# Patient Record
Sex: Female | Born: 1935 | Race: White | Hispanic: No
Health system: Southern US, Community
[De-identification: ages and names within clinical notes are randomized; demographics above are authoritative.]

---

## 2020-09-08 ENCOUNTER — Emergency Department (HOSPITAL_COMMUNITY): Payer: Medicare Other

## 2020-09-08 ENCOUNTER — Encounter (HOSPITAL_COMMUNITY): Payer: Self-pay

## 2020-09-08 ENCOUNTER — Other Ambulatory Visit: Payer: Self-pay

## 2020-09-08 ENCOUNTER — Inpatient Hospital Stay (HOSPITAL_COMMUNITY)
Admission: EM | Admit: 2020-09-08 | Discharge: 2020-09-14 | DRG: 871 | Disposition: A | Discharge status: Hospice | Payer: Medicare Other | Attending: Family Medicine | Admitting: Family Medicine

## 2020-09-08 DIAGNOSIS — Z20822 Contact with and (suspected) exposure to covid-19: Secondary | ICD-10-CM | POA: Diagnosis present

## 2020-09-08 DIAGNOSIS — Z66 Do not resuscitate: Secondary | ICD-10-CM | POA: Diagnosis present

## 2020-09-08 DIAGNOSIS — Z7401 Bed confinement status: Secondary | ICD-10-CM

## 2020-09-08 DIAGNOSIS — I50811 Acute right heart failure: Secondary | ICD-10-CM | POA: Diagnosis present

## 2020-09-08 DIAGNOSIS — J9601 Acute respiratory failure with hypoxia: Secondary | ICD-10-CM | POA: Diagnosis present

## 2020-09-08 DIAGNOSIS — R319 Hematuria, unspecified: Secondary | ICD-10-CM | POA: Diagnosis not present

## 2020-09-08 DIAGNOSIS — I351 Nonrheumatic aortic (valve) insufficiency: Secondary | ICD-10-CM | POA: Diagnosis not present

## 2020-09-08 DIAGNOSIS — A419 Sepsis, unspecified organism: Principal | ICD-10-CM | POA: Diagnosis present

## 2020-09-08 DIAGNOSIS — Z515 Encounter for palliative care: Secondary | ICD-10-CM

## 2020-09-08 DIAGNOSIS — I509 Heart failure, unspecified: Secondary | ICD-10-CM

## 2020-09-08 DIAGNOSIS — N390 Urinary tract infection, site not specified: Principal | ICD-10-CM

## 2020-09-08 DIAGNOSIS — I272 Pulmonary hypertension, unspecified: Secondary | ICD-10-CM | POA: Diagnosis present

## 2020-09-08 DIAGNOSIS — H9190 Unspecified hearing loss, unspecified ear: Secondary | ICD-10-CM | POA: Diagnosis present

## 2020-09-08 DIAGNOSIS — E039 Hypothyroidism, unspecified: Secondary | ICD-10-CM | POA: Diagnosis present

## 2020-09-08 DIAGNOSIS — G9341 Metabolic encephalopathy: Secondary | ICD-10-CM | POA: Diagnosis present

## 2020-09-08 DIAGNOSIS — Z7189 Other specified counseling: Secondary | ICD-10-CM | POA: Diagnosis not present

## 2020-09-08 DIAGNOSIS — J9602 Acute respiratory failure with hypercapnia: Secondary | ICD-10-CM | POA: Diagnosis present

## 2020-09-08 DIAGNOSIS — I110 Hypertensive heart disease with heart failure: Secondary | ICD-10-CM | POA: Diagnosis present

## 2020-09-08 DIAGNOSIS — I21A1 Myocardial infarction type 2: Secondary | ICD-10-CM | POA: Diagnosis present

## 2020-09-08 DIAGNOSIS — R54 Age-related physical debility: Secondary | ICD-10-CM | POA: Diagnosis present

## 2020-09-08 DIAGNOSIS — E785 Hyperlipidemia, unspecified: Secondary | ICD-10-CM | POA: Diagnosis present

## 2020-09-08 DIAGNOSIS — I214 Non-ST elevation (NSTEMI) myocardial infarction: Secondary | ICD-10-CM | POA: Diagnosis not present

## 2020-09-08 DIAGNOSIS — I361 Nonrheumatic tricuspid (valve) insufficiency: Secondary | ICD-10-CM | POA: Diagnosis not present

## 2020-09-08 DIAGNOSIS — R4182 Altered mental status, unspecified: Secondary | ICD-10-CM | POA: Diagnosis present

## 2020-09-08 LAB — CBC WITH DIFFERENTIAL/PLATELET
Abs Immature Granulocytes: 0.1 10*3/uL — ABNORMAL HIGH (ref 0.00–0.07)
Basophils Absolute: 0 10*3/uL (ref 0.0–0.1)
Basophils Relative: 0 %
Eosinophils Absolute: 0 10*3/uL (ref 0.0–0.5)
Eosinophils Relative: 0 %
HCT: 43.9 % (ref 36.0–46.0)
Hemoglobin: 13.6 g/dL (ref 12.0–15.0)
Immature Granulocytes: 1 %
Lymphocytes Relative: 6 %
Lymphs Abs: 0.5 10*3/uL — ABNORMAL LOW (ref 0.7–4.0)
MCH: 32.5 pg (ref 26.0–34.0)
MCHC: 31 g/dL (ref 30.0–36.0)
MCV: 105 fL — ABNORMAL HIGH (ref 80.0–100.0)
Monocytes Absolute: 0.5 10*3/uL (ref 0.1–1.0)
Monocytes Relative: 7 %
Neutro Abs: 6.6 10*3/uL (ref 1.7–7.7)
Neutrophils Relative %: 86 %
Platelets: 217 10*3/uL (ref 150–400)
RBC: 4.18 MIL/uL (ref 3.87–5.11)
RDW: 15.8 % — ABNORMAL HIGH (ref 11.5–15.5)
WBC: 7.8 10*3/uL (ref 4.0–10.5)
nRBC: 0 % (ref 0.0–0.2)

## 2020-09-08 LAB — URINALYSIS, ROUTINE W REFLEX MICROSCOPIC
Bacteria, UA: NONE SEEN
Bilirubin Urine: NEGATIVE
Glucose, UA: NEGATIVE mg/dL
Ketones, ur: NEGATIVE mg/dL
Nitrite: NEGATIVE
Protein, ur: 100 mg/dL — AB
RBC / HPF: >50 RBC/hpf — ABNORMAL HIGH (ref 0–5)
Specific Gravity, Urine: 1.018 (ref 1.005–1.030)
WBC, UA: >50 WBC/hpf — ABNORMAL HIGH (ref 0–5)
pH: 5 (ref 5.0–8.0)

## 2020-09-08 LAB — BLOOD GAS, ARTERIAL
Acid-Base Excess: 0.3 mmol/L (ref 0.0–2.0)
Acid-Base Excess: 4.9 mmol/L — ABNORMAL HIGH (ref 0.0–2.0)
Bicarbonate: 23 mmol/L (ref 20.0–28.0)
Bicarbonate: 27.7 mmol/L (ref 20.0–28.0)
FIO2: 80
FIO2: 28
O2 Saturation: 99.1 %
O2 Saturation: 87.5 %
Patient temperature: 35.3
Patient temperature: 37.2
pCO2 arterial: 64 mmHg — ABNORMAL HIGH (ref 32.0–48.0)
pCO2 arterial: 56.3 mmHg — ABNORMAL HIGH (ref 32.0–48.0)
pH, Arterial: 7.241 — ABNORMAL LOW (ref 7.350–7.450)
pH, Arterial: 7.35 (ref 7.350–7.450)
pO2, Arterial: 251 mmHg — ABNORMAL HIGH (ref 83.0–108.0)
pO2, Arterial: 59.7 mmHg — ABNORMAL LOW (ref 83.0–108.0)

## 2020-09-08 LAB — RESP PANEL BY RT-PCR (FLU A&B, COVID) ARPGX2
Influenza A by PCR: NEGATIVE
Influenza B by PCR: NEGATIVE
SARS Coronavirus 2 by RT PCR: NEGATIVE

## 2020-09-08 LAB — LACTIC ACID, PLASMA
Lactic Acid, Venous: 1.9 mmol/L (ref 0.5–1.9)
Lactic Acid, Venous: 1.6 mmol/L (ref 0.5–1.9)

## 2020-09-08 LAB — COMPREHENSIVE METABOLIC PANEL
ALT: 20 U/L (ref 0–44)
AST: 31 U/L (ref 15–41)
Albumin: 3.6 g/dL (ref 3.5–5.0)
Alkaline Phosphatase: 114 U/L (ref 38–126)
Anion gap: 10 (ref 5–15)
BUN: 44 mg/dL — ABNORMAL HIGH (ref 8–23)
CO2: 29 mmol/L (ref 22–32)
Calcium: 8.9 mg/dL (ref 8.9–10.3)
Chloride: 101 mmol/L (ref 98–111)
Creatinine, Ser: 1.08 mg/dL — ABNORMAL HIGH (ref 0.44–1.00)
GFR, Estimated: 48 mL/min — ABNORMAL LOW (ref >60)
Glucose, Bld: 117 mg/dL — ABNORMAL HIGH (ref 70–99)
Potassium: 4.3 mmol/L (ref 3.5–5.1)
Sodium: 140 mmol/L (ref 135–145)
Total Bilirubin: 0.8 mg/dL (ref 0.3–1.2)
Total Protein: 6.8 g/dL (ref 6.5–8.1)

## 2020-09-08 LAB — CK: Total CK: 180 U/L (ref 38–234)

## 2020-09-08 LAB — TROPONIN I (HIGH SENSITIVITY)
Troponin I (High Sensitivity): 193 ng/L (ref <18)
Troponin I (High Sensitivity): 235 ng/L (ref <18)

## 2020-09-08 LAB — PROTIME-INR
INR: 1.1 (ref 0.8–1.2)
Prothrombin Time: 13.4 seconds (ref 11.4–15.2)

## 2020-09-08 LAB — BRAIN NATRIURETIC PEPTIDE: B Natriuretic Peptide: 1137 pg/mL — ABNORMAL HIGH (ref 0.0–100.0)

## 2020-09-08 LAB — APTT: aPTT: 32 seconds (ref 24–36)

## 2020-09-08 LAB — MAGNESIUM: Magnesium: 2.2 mg/dL (ref 1.7–2.4)

## 2020-09-08 MED ORDER — SODIUM CHLORIDE 0.9% FLUSH
3.0000 mL | Freq: Two times a day (BID) | INTRAVENOUS | Status: DC
  Administered 2020-09-08 – 2020-09-14 (×11): 3 mL via INTRAVENOUS

## 2020-09-08 MED ORDER — SODIUM CHLORIDE 0.9 % IV SOLN: 250.0000 mL | INTRAVENOUS | Status: DC | PRN

## 2020-09-08 MED ORDER — SODIUM CHLORIDE 0.9 % IV BOLUS
2000.0000 mL | Freq: Once | INTRAVENOUS | Status: AC
  Administered 2020-09-08: 2000 mL via INTRAVENOUS

## 2020-09-08 MED ORDER — SODIUM CHLORIDE 0.9 % IV SOLN
1.0000 g | INTRAVENOUS | Status: AC
  Administered 2020-09-09 – 2020-09-11 (×3): 1 g via INTRAVENOUS
  Filled 2020-09-08 (×3): qty 10

## 2020-09-08 MED ORDER — ASPIRIN 300 MG RE SUPP
300.0000 mg | Freq: Every day | RECTAL | Status: DC
  Administered 2020-09-08 – 2020-09-09 (×2): 300 mg via RECTAL
  Filled 2020-09-08 (×2): qty 1

## 2020-09-08 MED ORDER — ATROPINE SULFATE 1 MG/10ML IJ SOSY
PREFILLED_SYRINGE | INTRAMUSCULAR | Status: AC
  Administered 2020-09-08: 1 mg
  Filled 2020-09-08: qty 10

## 2020-09-08 MED ORDER — ONDANSETRON HCL 4 MG/2ML IJ SOLN: 4.0000 mg | Freq: Four times a day (QID) | INTRAMUSCULAR | Status: DC | PRN

## 2020-09-08 MED ORDER — SODIUM CHLORIDE 0.9 % IV SOLN
2.0000 g | Freq: Once | INTRAVENOUS | Status: AC
  Administered 2020-09-08: 2 g via INTRAVENOUS
  Filled 2020-09-08: qty 20

## 2020-09-08 MED ORDER — ACETAMINOPHEN 325 MG PO TABS: 650.0000 mg | ORAL_TABLET | ORAL | Status: DC | PRN

## 2020-09-08 MED ORDER — FUROSEMIDE 10 MG/ML IJ SOLN
40.0000 mg | Freq: Once | INTRAMUSCULAR | Status: AC
  Administered 2020-09-08: 40 mg via INTRAVENOUS
  Filled 2020-09-08: qty 4

## 2020-09-08 MED ORDER — SODIUM CHLORIDE 0.9% FLUSH: 3.0000 mL | INTRAVENOUS | Status: DC | PRN

## 2020-09-08 MED ORDER — HEPARIN (PORCINE) 25000 UT/250ML-% IV SOLN
750.0000 [IU]/h | INTRAVENOUS | Status: DC
  Administered 2020-09-08: 750 [IU]/h via INTRAVENOUS
  Filled 2020-09-08 (×2): qty 250

## 2020-09-08 MED ORDER — FUROSEMIDE 10 MG/ML IJ SOLN
40.0000 mg | Freq: Two times a day (BID) | INTRAMUSCULAR | Status: DC
  Administered 2020-09-08 – 2020-09-09 (×2): 40 mg via INTRAVENOUS
  Filled 2020-09-08 (×2): qty 4

## 2020-09-08 MED ORDER — METRONIDAZOLE IN NACL 5-0.79 MG/ML-% IV SOLN: 500.0000 mg | Freq: Once | INTRAVENOUS | Status: DC

## 2020-09-08 MED ORDER — ATROPINE SULFATE 1 MG/ML IJ SOLN
INTRAMUSCULAR | Status: AC
  Administered 2020-09-08: 1 mg via INTRAVENOUS
  Filled 2020-09-08: qty 1

## 2020-09-08 MED ORDER — ATROPINE SULFATE 1 MG/ML IJ SOLN: 1.0000 mg | Freq: Once | INTRAMUSCULAR | Status: AC

## 2020-09-08 NOTE — ED Provider Notes (Addendum)
[facility] EMERGENCY DEPARTMENT Provider Note   CSN: [id number] Arrival date & time: 09/08/20  1203     History Chief Complaint  Patient presents with  . Altered Mental Status    Jessica Key is a 86 y.o. female.  Patient has been having diarrhea this week.  She spoke with her family member at 3:00 last night.  And then she was found on the floor today.  Patient has no history of medical problems and lives by herself.  The history is provided by medical records and a relative. No language interpreter was used.  Altered Mental Status Presenting symptoms: behavior changes   Severity:  Severe Most recent episode:  Today Episode history:  Continuous Timing:  Constant Progression:  Worsening Chronicity:  New Context: not alcohol use   Associated symptoms: no abdominal pain        History reviewed. No pertinent past medical history.  Patient Active Problem List   Diagnosis Date Noted  . Sepsis secondary to UTI (HCC) 09/08/2020    History reviewed. No pertinent surgical history.   OB History   No obstetric history on file.     No family history on file.  Social History   Tobacco Use  . Smoking status: Not on file  Substance Use Topics  . Alcohol use: Not on file  . Drug use: Not on file    Home Medications Prior to Admission medications   Not on File    Allergies    Patient has no known allergies.  Review of Systems   Review of Systems  Unable to perform ROS: Mental status change  Gastrointestinal: Negative for abdominal pain.    Physical Exam Updated Vital Signs BP (!) 115/58   Pulse (!) 57   Temp (!) 95.5 F (35.3 C)   Resp 14   Wt 63 kg   SpO2 100%   Physical Exam Vitals and nursing note reviewed.  Constitutional:      Appearance: She is well-developed.     Comments: Lethargic  HENT:     Head: Normocephalic.     Nose: Nose normal.  Eyes:     General: No scleral icterus.    Conjunctiva/sclera: Conjunctivae normal.  Neck:      Thyroid: No thyromegaly.  Cardiovascular:     Rate and Rhythm: Normal rate and regular rhythm.     Heart sounds: No murmur heard.  No friction rub. No gallop.   Pulmonary:     Breath sounds: No stridor. No wheezing or rales.  Chest:     Chest wall: No tenderness.  Abdominal:     General: There is no distension.     Tenderness: There is no abdominal tenderness. There is no rebound.  Musculoskeletal:        General: Normal range of motion.     Cervical back: Neck supple.  Lymphadenopathy:     Cervical: No cervical adenopathy.  Skin:    Findings: No erythema or rash.  Neurological:     Motor: No abnormal muscle tone.     Coordination: Coordination normal.     Comments: Patient responding to painful stimuli with pinpoint pupils she is protecting her airway     ED Results / Procedures / Treatments   Labs (all labs ordered are listed, but only abnormal results are displayed) Labs Reviewed  COMPREHENSIVE METABOLIC PANEL - Abnormal; Notable for the following components:      Result Value   Glucose, Bld 117 (*)    BUN 44 (*)  Creatinine, Ser 1.08 (*)    GFR, Estimated 48 (*)    All other components within normal limits  CBC WITH DIFFERENTIAL/PLATELET - Abnormal; Notable for the following components:   MCV 105.0 (*)    RDW 15.8 (*)    Lymphs Abs 0.5 (*)    Abs Immature Granulocytes 0.10 (*)    All other components within normal limits  URINALYSIS, ROUTINE W REFLEX MICROSCOPIC - Abnormal; Notable for the following components:   Color, Urine AMBER (*)    APPearance HAZY (*)    Hgb urine dipstick MODERATE (*)    Protein, ur 100 (*)    Leukocytes,Ua MODERATE (*)    RBC / HPF >50 (*)    WBC, UA >50 (*)    All other components within normal limits  BRAIN NATRIURETIC PEPTIDE - Abnormal; Notable for the following components:   B Natriuretic Peptide 1,137.0 (*)    All other components within normal limits  BLOOD GAS, ARTERIAL - Abnormal; Notable for the following components:    pH, Arterial 7.241 (*)    pCO2 arterial 64.0 (*)    pO2, Arterial 251 (*)    All other components within normal limits  TROPONIN I (HIGH SENSITIVITY) - Abnormal; Notable for the following components:   Troponin I (High Sensitivity) 193 (*)    All other components within normal limits  CULTURE, BLOOD (ROUTINE X 2)  CULTURE, BLOOD (ROUTINE X 2)  RESP PANEL BY RT-PCR (FLU A&B, COVID) ARPGX2  URINE CULTURE  LACTIC ACID, PLASMA  PROTIME-INR  APTT  CK  LACTIC ACID, PLASMA  TROPONIN I (HIGH SENSITIVITY)    EKG EKG Interpretation  Date/Time:  Friday September 08 2020 12:14:52 EST Ventricular Rate:  63 PR Interval:    QRS Duration: 128 QT Interval:  447 QTC Calculation: 458 R Axis:   70 Text Interpretation: Sinus rhythm Right bundle branch block Repol abnrm, prob ischemia, anterolateral lds Confirmed by Bethann Berkshire [phone]) on 09/08/2020 1:34:54 PM   Radiology CT Head Wo Contrast  Result Date: 09/08/2020 CLINICAL DATA:  86 year old female found down. EXAM: CT HEAD WITHOUT CONTRAST TECHNIQUE: Contiguous axial images were obtained from the base of the skull through the vertex without intravenous contrast. COMPARISON:  None. FINDINGS: Brain: Fairly symmetric and circumscribed bilateral hyperdense choroid plexus cysts (series 2, image 16). No intraventricular hemorrhage suspected. A no ventriculomegaly. No midline shift, mass effect, evidence of mass lesion, intracranial hemorrhage or evidence of cortically based acute infarction. Patchy bilateral white matter hypodensity. No cortical encephalomalacia identified. Vascular: Mild Calcified atherosclerosis at the skull base. No suspicious intracranial vascular hyperdensity. Skull: No fracture identified. Partially visible upper cervical spine degeneration. Sinuses/Orbits: Visualized paranasal sinuses and mastoids are well pneumatized. Other: No discrete orbit or scalp soft tissue injury identified. IMPRESSION: No acute intracranial abnormality  or acute traumatic injury identified. Electronically Signed   By: Odessa Fleming M.D.   On: 09/08/2020 14:06   DG Pelvis Portable  Result Date: 09/08/2020 CLINICAL DATA:  Status post fall. EXAM: PORTABLE PELVIS 1-2 VIEWS COMPARISON:  None. FINDINGS: Diminished exam detail due to patient positioning. The bones appear diffusely osteopenic. No fracture or dislocation identified. Degenerative disc disease noted within the imaged portions of the lumbar spine. IMPRESSION: 1. Suboptimal exam due to patient positioning. No acute findings. If there is a continued concern for fracture recommend repeat imaging as patient's clinical condition tolerates. 2. Osteopenia. Electronically Signed   By: Signa Kell M.D.   On: 09/08/2020 13:22   DG Chest Port 1  View  Result Date: 09/08/2020 CLINICAL DATA:  Questionable sepsis. Evaluate for pulmonary abnormality. EXAM: PORTABLE CHEST 1 VIEW COMPARISON:  None. FINDINGS: Cardiac enlargement and aortic atherosclerosis. Pulmonary vascular congestion. No frank edema, pleural effusion or airspace consolidation. IMPRESSION: Cardiac enlargement and pulmonary vascular congestion. Aortic Atherosclerosis (ICD10-I70.0). Electronically Signed   By: Signa Kell M.D.   On: 09/08/2020 13:19    Procedures Procedures (including critical care time)  Medications Ordered in ED Medications  cefTRIAXone (ROCEPHIN) 2 g in sodium chloride 0.9 % 100 mL IVPB (0 g Intravenous Stopped 09/08/20 1343)  sodium chloride 0.9 % bolus 2,000 mL (0 mLs Intravenous Stopped 09/08/20 1337)  furosemide (LASIX) injection 40 mg (40 mg Intravenous Given 09/08/20 1440)    ED Course  I have reviewed the triage vital signs and the nursing notes.  Pertinent labs & imaging results that were available during my care of the patient were reviewed by me and considered in my medical decision making (see chart for details). CRITICAL CARE Performed by: Bethann Berkshire Total critical care time: 35 minutes Critical  care time was exclusive of separately billable procedures and treating other patients. Critical care was necessary to treat or prevent imminent or life-threatening deterioration. Critical care was time spent personally by me on the following activities: development of treatment plan with patient and/or surrogate as well as nursing, discussions with consultants, evaluation of patient's response to treatment, examination of patient, obtaining history from patient or surrogate, ordering and performing treatments and interventions, ordering and review of laboratory studies, ordering and review of radiographic studies, pulse oximetry and re-evaluation of patient's condition.    MDM Rules/Calculators/A&P                          Patient with congestive heart failure and urinary tract infection and altered mental status.  Patient will be admitted to the hospital and hospitalist is coming to evaluate the patient Final Clinical Impression(s) / ED Diagnoses Final diagnoses:  None    Rx / DC Orders ED Discharge Orders    None       Bethann Berkshire, MD 09/12/20 1610    Bethann Berkshire, MD 09/18/20 1020

## 2020-09-08 NOTE — ED Notes (Signed)
Date and time results received: 09/08/20 1522   Test: Trop Critical Value: 235  Name of Provider Notified: [place]  Orders Received? Or Actions Taken?:

## 2020-09-08 NOTE — ED Triage Notes (Addendum)
Pt brought in by EMS due to be found on floor by daughter. Night stand knocked over. Pt normally lives alone, [place]. Reports diarrhea since Monday. Pt responds to painful stimuli and pulls covers up on herself.  CBG 190. sats 94% on scene BP 126/73. Last spoken to between 1-3 pm yesterday. Pt has not been to doctor in many years per EMS

## 2020-09-08 NOTE — Progress Notes (Signed)
ANTICOAGULATION CONSULT NOTE - Initial Consult  Pharmacy Consult for heparin gtt  Indication: chest pain/ACS  No Known Allergies  Patient Measurements: Weight: 63 kg (138 lb 14.4 oz)  Vital Signs: Temp: 95.5 F (35.3 C) (11/26 1500) Temp Source: Rectal (11/26 1242) BP: 135/52 (11/26 1500) Pulse Rate: 59 (11/26 1500)  Labs: Recent Labs    09/08/20 1249  HGB 13.6  HCT 43.9  PLT 217  APTT 32  LABPROT 13.4  INR 1.1  CREATININE 1.08*  CKTOTAL 180    CrCl cannot be calculated (Unknown ideal weight.).   Medical History: History reviewed. No pertinent past medical history.  Medications:  (Not in a hospital admission)  Scheduled:   Infusions:  . heparin     PRN:  Anti-infectives (From admission, onward)   Start     Dose/Rate Route Frequency Ordered Stop   09/08/20 1230  cefTRIAXone (ROCEPHIN) 2 g in sodium chloride 0.9 % 100 mL IVPB        2 g 200 mL/hr over 30 Minutes Intravenous  Once 09/08/20 1224 09/08/20 1343   09/08/20 1230  metroNIDAZOLE (FLAGYL) IVPB 500 mg  Status:  Discontinued        500 mg 100 mL/hr over 60 Minutes Intravenous  Once 09/08/20 1224 09/08/20 1401      Assessment: Jessica Key a 86 y.o. female requires anticoagulation with a heparin iv infusion for the indication of  chest pain/ACS. Heparin gtt will be started following pharmacy protocol per pharmacy consult. Patient is not on previous oral anticoagulant that will require aPTT/HL correlation before transitioning to only HL monitoring.   Goal of Therapy:  Heparin level 0.3-0.7 units/ml Monitor platelets by anticoagulation protocol: Yes   Plan:  Give 0 per MD  units bolus x 1 Start heparin infusion at 750 units/hr Check anti-Xa level in 8 hours and daily while on heparin Continue to monitor H&H and platelets  Heparin level to be drawn in 8 hours for patients >20 years old or crcl < 44ml/min  Jessica Key 09/08/2020,3:53 PM

## 2020-09-08 NOTE — H&P (Signed)
History and Physical    Jessica Key LKG:[id number] DOB: 07-31-1927 DOA: 09/08/2020  PCP: Pcp, No   Patient coming from: Home (living by herself alone)  I have personally briefly reviewed patient's old medical records in [place]  Chief Complaint: Shortness of breath, unresponsiveness  HPI: Jessica Key is a 86 y.o. female with medical history significant of hypertension; she has not been seen or follow lately with any medical provider or taking prescription medications.  Acute encephalopathy is preventing patient to participate with interview and information has been collected after discussing with patient's daughter, patient's son, review of EMS records and talking with ED provider.  Apparently patient was not feeling well and having some diarrhea since Monday; last line the family spoke with her was on 09/07/2020; at that moment she expressed feeling weak and not feeling well.  Patient's daughter visited her today and found her on the floor unresponsive.  No fever, no nausea, no vomiting, no chest pain, no hematuria, no melena, no hematochezia or any other complaints reported.  CT head was negative for any acute intracranial normalities in the ED.  ED Course: Patient work-up demonstrating acute respiratory failure with hypoxia and hypercapnia; chest x-ray with cardiomegaly, vascular congestion and pulmonary edema.  Elevated BNP and elevated troponin.  Urinalysis suggesting infection.  On admission patient was hypothermic, tachycardic, tachypneic and with hypoxia.  She has met sepsis criteria and received bolus of fluid and antibiotics after blood cultures and urine culture taken.  After finding x-ray changes and elevated BNP Lasix initiated.  TRH has been contacted to place patient in the hospital for further evaluation and management.  Review of Systems: As per HPI otherwise all other systems reviewed and are negative.   Past medical history and surgical history -No significant history  found on our records -Acute encephalopathy preventing patients to participate in this review. -Per son and daughter reports patient having history of hypertension, but not taking any prescription medication prior to admission.   Social History  has no history on file for tobacco use, alcohol use, and drug use.  No Known Allergies  Family history: -Discussed with patient's on; positive for hypertension and cholesterol. -Due to acute metabolic encephalopathy patient not able to participate in review of family history.  Prior to Admission medications   Not on File    Physical Exam: Vitals:   09/08/20 1630 09/08/20 1632 09/08/20 1700 09/08/20 1800  BP:  (!) 137/122    Pulse: 84 83 75 64  Resp: (!) $RemoveB'26 17  12  'FyolEvYD$ Temp: (!) 97.5 F (36.4 C) 97.7 F (36.5 C) 98.2 F (36.8 C) 98.6 F (37 C)  TempSrc:      SpO2: 93% 93% (!) 88% 93%  Weight:        Constitutional: No following commands, mild respiratory distress appreciated.  Call to palpation. Vitals:   09/08/20 1630 09/08/20 1632 09/08/20 1700 09/08/20 1800  BP:  (!) 137/122    Pulse: 84 83 75 64  Resp: (!) $RemoveB'26 17  12  'gjRQfdYD$ Temp: (!) 97.5 F (36.4 C) 97.7 F (36.5 C) 98.2 F (36.8 C) 98.6 F (37 C)  TempSrc:      SpO2: 93% 93% (!) 88% 93%  Weight:       Eyes: PERRL, lids and conjunctivae normal, no icterus, no nystagmus. ENMT: Mucous membranes are moist. Posterior pharynx clear of any exudate or lesions. Neck: normal, supple, no masses, no thyromegaly Respiratory: Fine crackles at the bases bilaterally; positive rhonchi, no wheezing.  Mild use of accessory muscles appreciated.  Positive tachypnea Cardiovascular: Regular rate and rhythm, no rubs, no gallops, no JVD on exam. Abdomen: no tenderness, no masses palpated. No hepatosplenomegaly. Bowel sounds positive.  Musculoskeletal: no clubbing / cyanosis. No joint deformity upper and lower extremities. Good ROM, no contractures. Normal muscle tone.  Skin: no petechiae. Neurologic:  Unable to be fully assessed secondary to patient current mentation; she opens her eyes but does not track to voice commands.  Moving 4 limbs spontaneously. Psychiatric: Unable to be fully assess secondary to patient current mentation.   Labs on Admission: I have personally reviewed following labs and imaging studies  CBC: Recent Labs  Lab 09/08/20 1249  WBC 7.8  NEUTROABS 6.6  HGB 13.6  HCT 43.9  MCV 105.0*  PLT 147    Basic Metabolic Panel: Recent Labs  Lab 09/08/20 1249  NA 140  K 4.3  CL 101  CO2 29  GLUCOSE 117*  BUN 44*  CREATININE 1.08*  CALCIUM 8.9    GFR: CrCl cannot be calculated (Unknown ideal weight.).  Liver Function Tests: Recent Labs  Lab 09/08/20 1249  AST 31  ALT 20  ALKPHOS 114  BILITOT 0.8  PROT 6.8  ALBUMIN 3.6    Urine analysis:    Component Value Date/Time   COLORURINE AMBER (A) 09/08/2020 1315   APPEARANCEUR HAZY (A) 09/08/2020 1315   LABSPEC 1.018 09/08/2020 1315   PHURINE 5.0 09/08/2020 1315   GLUCOSEU NEGATIVE 09/08/2020 1315   HGBUR MODERATE (A) 09/08/2020 1315   BILIRUBINUR NEGATIVE 09/08/2020 [place] 09/08/2020 1315   PROTEINUR 100 (A) 09/08/2020 1315   NITRITE NEGATIVE 09/08/2020 1315   LEUKOCYTESUR MODERATE (A) 09/08/2020 1315    Radiological Exams on Admission: CT Head Wo Contrast  Result Date: 09/08/2020 CLINICAL DATA:  86 year old female found down. EXAM: CT HEAD WITHOUT CONTRAST TECHNIQUE: Contiguous axial images were obtained from the base of the skull through the vertex without intravenous contrast. COMPARISON:  None. FINDINGS: Brain: Fairly symmetric and circumscribed bilateral hyperdense choroid plexus cysts (series 2, image 16). No intraventricular hemorrhage suspected. A no ventriculomegaly. No midline shift, mass effect, evidence of mass lesion, intracranial hemorrhage or evidence of cortically based acute infarction. Patchy bilateral white matter hypodensity. No cortical encephalomalacia  identified. Vascular: Mild Calcified atherosclerosis at the skull base. No suspicious intracranial vascular hyperdensity. Skull: No fracture identified. Partially visible upper cervical spine degeneration. Sinuses/Orbits: Visualized paranasal sinuses and mastoids are well pneumatized. Other: No discrete orbit or scalp soft tissue injury identified. IMPRESSION: No acute intracranial abnormality or acute traumatic injury identified. Electronically Signed   By: Genevie Ann M.D.   On: 09/08/2020 14:06   DG Pelvis Portable  Result Date: 09/08/2020 CLINICAL DATA:  Status post fall. EXAM: PORTABLE PELVIS 1-2 VIEWS COMPARISON:  None. FINDINGS: Diminished exam detail due to patient positioning. The bones appear diffusely osteopenic. No fracture or dislocation identified. Degenerative disc disease noted within the imaged portions of the lumbar spine. IMPRESSION: 1. Suboptimal exam due to patient positioning. No acute findings. If there is a continued concern for fracture recommend repeat imaging as patient's clinical condition tolerates. 2. Osteopenia. Electronically Signed   By: Kerby Moors M.D.   On: 09/08/2020 13:22   DG Chest Port 1 View  Result Date: 09/08/2020 CLINICAL DATA:  Questionable sepsis. Evaluate for pulmonary abnormality. EXAM: PORTABLE CHEST 1 VIEW COMPARISON:  None. FINDINGS: Cardiac enlargement and aortic atherosclerosis. Pulmonary vascular congestion. No frank edema, pleural effusion or airspace consolidation. IMPRESSION: Cardiac  enlargement and pulmonary vascular congestion. Aortic Atherosclerosis (ICD10-I70.0). Electronically Signed   By: Kerby Moors M.D.   On: 09/08/2020 13:19    EKG: Independently reviewed.  Sinus bradycardia; no acute ischemic changes.  Assessment/Plan 1-sepsis secondary to UTI ([place]) -Sepsis was present on admission -Criteria made with hypothermia, tachycardia, tachypnea and hypoxia -Source of infection in urine -Cultures taken -Fluid resuscitation given in the  ED and patient started on Rocephin.  2-Acute CHF (congestive heart failure) (HCC) -unspecified currently -will check 2-D echo -started on IV lasix -daily weight and strict I's and O's.  3-NSTEMI (non-ST elevated myocardial infarction) ([place]) -in the setting of demand ischemia and CHF -will start patient on IV heparin and aspirin  -follow troponin trend -no b-blocker due to bradycardia on admission   4-GI prophylaxis -started on protonix  5-Acute respiratory failure with hypoxia and hypercapnia ([place]) -started on BIPAP -repeat abg -continue treatment for vascular congestion.  6-Acute metabolic encephalopathy -in te setting of sepsis and hypercapnia -will follow clinical response  7-DNR -Wishes will be respected -Discussed in detail with daughter and son who is the patient's POA -Will provide treatment as needed without escalating to invasive management.   DVT prophylaxis: Heparin drip. Code Status:   DNR/DNI Family Communication:  Daughter and Son (who is POA) by phone  Disposition Plan:   Patient is from:  Home  Anticipated DC to:  To be determined  Anticipated DC date:  To be determined  Anticipated DC barriers: Stabilization of patient's respiratory status and mentation. Consults called:  Cardiology curbside (recommendations given for heparin drip, treatment for CHF, aspirin and 2D echo). Admission status:  Inpatient, length of stay more than 2 midnights; stepdown bed.  Severity of Illness: Severe illness; presenting to the ED by EMS after was found down unresponsive.  Positive encephalopathy on presentation and acute respiratory failure with hypoxia and hypercapnia.  Initial work-up demonstrating positive UTI meeting criteria for sepsis with hypothermia, tachycardia, increased respiratory rate and hypoxia.  Patient also with significant elevation in her troponin meeting criteria for NSTEMI; and also elevated BNP with vascular congestion/pulmonary edema.  Will be admitted  to stepdown bed for further evaluation and management of acute unspecified CHF, heparin drip, 2D echo, bear hugger, IV antibiotic for UTI and supportive care.    Barton Dubois MD [facility]  How to contact the [facility] Attending or Consulting provider [place] or covering provider during after hours [place], for this patient?   1. Check the care team in [facility] and look for a) attending/consulting [facility] provider listed and b) the [facility] team listed 2. Log into [email or web address] and use [place]'s universal password to access. If you do not have the password, please contact the hospital operator. 3. Locate the [facility] provider you are looking for under [facility] and page to a number that you can be directly reached. 4. If you still have difficulty reaching the provider, please page the [facility] (Director on Call) for the Hospitalists listed on amion for assistance.  09/08/2020, 6:44 PM

## 2020-09-08 NOTE — Progress Notes (Signed)
Patient taken off BiPAP , blood gas drawn on 2 liters. BiPAP on standby if needed.

## 2020-09-08 NOTE — Progress Notes (Signed)
eLink is monitoring this sepsis. Thank you 

## 2020-09-09 ENCOUNTER — Other Ambulatory Visit (HOSPITAL_COMMUNITY): Payer: Self-pay | Admitting: *Deleted

## 2020-09-09 ENCOUNTER — Inpatient Hospital Stay (HOSPITAL_COMMUNITY): Payer: Medicare Other

## 2020-09-09 DIAGNOSIS — I509 Heart failure, unspecified: Secondary | ICD-10-CM | POA: Diagnosis not present

## 2020-09-09 DIAGNOSIS — A419 Sepsis, unspecified organism: Secondary | ICD-10-CM | POA: Diagnosis not present

## 2020-09-09 DIAGNOSIS — I361 Nonrheumatic tricuspid (valve) insufficiency: Secondary | ICD-10-CM | POA: Diagnosis not present

## 2020-09-09 DIAGNOSIS — G9341 Metabolic encephalopathy: Secondary | ICD-10-CM | POA: Diagnosis not present

## 2020-09-09 DIAGNOSIS — N390 Urinary tract infection, site not specified: Secondary | ICD-10-CM | POA: Diagnosis not present

## 2020-09-09 DIAGNOSIS — I351 Nonrheumatic aortic (valve) insufficiency: Secondary | ICD-10-CM | POA: Diagnosis not present

## 2020-09-09 DIAGNOSIS — I214 Non-ST elevation (NSTEMI) myocardial infarction: Secondary | ICD-10-CM | POA: Diagnosis not present

## 2020-09-09 LAB — CBC
HCT: 41.9 % (ref 36.0–46.0)
Hemoglobin: 13.4 g/dL (ref 12.0–15.0)
MCH: 32.8 pg (ref 26.0–34.0)
MCHC: 32 g/dL (ref 30.0–36.0)
MCV: 102.4 fL — ABNORMAL HIGH (ref 80.0–100.0)
Platelets: 195 10*3/uL (ref 150–400)
RBC: 4.09 MIL/uL (ref 3.87–5.11)
RDW: 15.4 % (ref 11.5–15.5)
WBC: 10.6 10*3/uL — ABNORMAL HIGH (ref 4.0–10.5)
nRBC: 0.2 % (ref 0.0–0.2)

## 2020-09-09 LAB — HEPARIN LEVEL (UNFRACTIONATED)
Heparin Unfractionated: 0.48 IU/mL (ref 0.30–0.70)
Heparin Unfractionated: 0.68 IU/mL (ref 0.30–0.70)

## 2020-09-09 LAB — VITAMIN B12: Vitamin B-12: 1362 pg/mL — ABNORMAL HIGH (ref 180–914)

## 2020-09-09 LAB — MRSA PCR SCREENING: MRSA by PCR: NEGATIVE

## 2020-09-09 LAB — LIPID PANEL
Cholesterol: 188 mg/dL (ref 0–200)
HDL: 47 mg/dL (ref >40)
LDL Cholesterol: 124 mg/dL — ABNORMAL HIGH (ref 0–99)
Total CHOL/HDL Ratio: 4 RATIO
Triglycerides: 86 mg/dL (ref <150)
VLDL: 17 mg/dL (ref 0–40)

## 2020-09-09 LAB — BASIC METABOLIC PANEL
Anion gap: 10 (ref 5–15)
BUN: 35 mg/dL — ABNORMAL HIGH (ref 8–23)
CO2: 31 mmol/L (ref 22–32)
Calcium: 8.5 mg/dL — ABNORMAL LOW (ref 8.9–10.3)
Chloride: 100 mmol/L (ref 98–111)
Creatinine, Ser: 0.92 mg/dL (ref 0.44–1.00)
GFR, Estimated: 59 mL/min — ABNORMAL LOW (ref >60)
Glucose, Bld: 79 mg/dL (ref 70–99)
Potassium: 3.2 mmol/L — ABNORMAL LOW (ref 3.5–5.1)
Sodium: 141 mmol/L (ref 135–145)

## 2020-09-09 LAB — D-DIMER, QUANTITATIVE: D-Dimer, Quant: 0.44 ug/mL-FEU (ref 0.00–0.50)

## 2020-09-09 LAB — VITAMIN D 25 HYDROXY (VIT D DEFICIENCY, FRACTURES): Vit D, 25-Hydroxy: 28.81 ng/mL — ABNORMAL LOW (ref 30–100)

## 2020-09-09 LAB — ECHOCARDIOGRAM COMPLETE
Area-P 1/2: 2.78 cm2
S' Lateral: 2.5 cm
Weight: 2155.22 oz

## 2020-09-09 LAB — CORTISOL: Cortisol, Plasma: 31.5 ug/dL

## 2020-09-09 LAB — TSH: TSH: 36.246 u[IU]/mL — ABNORMAL HIGH (ref 0.350–4.500)

## 2020-09-09 MED ORDER — LEVOTHYROXINE SODIUM 50 MCG PO TABS
50.0000 ug | ORAL_TABLET | Freq: Every day | ORAL | Status: DC
  Administered 2020-09-11 – 2020-09-13 (×3): 50 ug via ORAL
  Filled 2020-09-09: qty 1
  Filled 2020-09-09 (×2): qty 2

## 2020-09-09 MED ORDER — CHLORHEXIDINE GLUCONATE CLOTH 2 % EX PADS
6.0000 | MEDICATED_PAD | Freq: Every day | CUTANEOUS | Status: DC
  Administered 2020-09-09 – 2020-09-14 (×5): 6 via TOPICAL

## 2020-09-09 MED ORDER — ALBUTEROL SULFATE (2.5 MG/3ML) 0.083% IN NEBU
INHALATION_SOLUTION | RESPIRATORY_TRACT | Status: AC
  Administered 2020-09-09: 2.5 mg
  Filled 2020-09-09: qty 3

## 2020-09-09 MED ORDER — PANTOPRAZOLE SODIUM 40 MG PO TBEC
40.0000 mg | DELAYED_RELEASE_TABLET | Freq: Every day | ORAL | Status: DC
  Administered 2020-09-09 – 2020-09-13 (×5): 40 mg via ORAL
  Filled 2020-09-09 (×4): qty 1

## 2020-09-09 MED ORDER — SIMVASTATIN 20 MG PO TABS
20.0000 mg | ORAL_TABLET | Freq: Every day | ORAL | Status: DC
  Administered 2020-09-09 – 2020-09-12 (×4): 20 mg via ORAL
  Filled 2020-09-09 (×4): qty 1

## 2020-09-09 MED ORDER — POTASSIUM CHLORIDE 10 MEQ/100ML IV SOLN
10.0000 meq | INTRAVENOUS | Status: AC
  Administered 2020-09-09 (×3): 10 meq via INTRAVENOUS
  Filled 2020-09-09 (×3): qty 100

## 2020-09-09 MED ORDER — ASPIRIN EC 81 MG PO TBEC
81.0000 mg | DELAYED_RELEASE_TABLET | Freq: Every day | ORAL | Status: DC
  Administered 2020-09-10 – 2020-09-13 (×4): 81 mg via ORAL
  Filled 2020-09-09 (×4): qty 1

## 2020-09-09 MED ORDER — FUROSEMIDE 10 MG/ML IJ SOLN: 40.0000 mg | Freq: Every day | INTRAMUSCULAR | Status: DC

## 2020-09-09 NOTE — Progress Notes (Signed)
*  PRELIMINARY RESULTS* Echocardiogram 2D Echocardiogram has been performed.  Stacey Drain 09/09/2020, 1:11 PM

## 2020-09-09 NOTE — TOC Initial Note (Signed)
Transition of Care [facility]) - Initial/Assessment Note    Patient Details  Name: Jessica Key MRN: [id number] Date of Birth: Mar 12, 1927  Transition of Care [facility]) CM/SW Contact:    Barry Brunner, LCSW Phone Number: 09/09/2020, 3:12 PM  Clinical Narrative:                 Patient is a 86 year old female admitted for Sepsis Secondary to UTI. CSW received consult for CHF and SNF. CSW discussed CHF recommendations with patient's son. Patient's son reported that the CHF diagnosis is new for the patient. CSW informed patient's son that it is recommended that patient maintain a heart healthy diet, monitor fluid intake, and weigh her self daily. CSW inquired about patient's agreeableness to SNF. Patient's son reported that he did not know if patient would be agreeable to SNF, but would prefer HH. Patient not fully oriented at the time of consult. Patient's son reported that patient currently utilizes [facility] for home care services, but was not sure if they provided RN or PT services. It was expressed that if they were able to provide RN and PT, that they would be the preferred provider. The number provided for [facility] is [phone]. TOC to follow  Expected Discharge Plan: Home w Home Health Services Barriers to Discharge: Continued Medical Work up   Patient Goals and CMS Choice     Choice offered to / list presented to : Patient  Expected Discharge Plan and Services Expected Discharge Plan: Home w Home Health Services       Living arrangements for the past 2 months: Single Family Home                                      Prior Living Arrangements/Services Living arrangements for the past 2 months: Single Family Home Lives with:: Self Patient language and need for interpreter reviewed:: No        Need for Family Participation in Patient Care: Yes (Comment) Care giver support system in place?: Yes (comment)   Criminal Activity/Legal Involvement Pertinent to Current  Situation/Hospitalization: No - Comment as needed  Activities of Daily Living Home Assistive Devices/Equipment: Walker (specify type), Eyeglasses ADL Screening (condition at time of admission) Patient's cognitive ability adequate to safely complete daily activities?: No Is the patient deaf or have difficulty hearing?: Yes Does the patient have difficulty seeing, even when wearing glasses/contacts?: No Does the patient have difficulty concentrating, remembering, or making decisions?: Yes Patient able to express need for assistance with ADLs?: No Does the patient have difficulty dressing or bathing?: Yes Independently performs ADLs?: No Communication: Independent Dressing (OT): Dependent Is this a change from baseline?: Change from baseline, expected to last >3 days Grooming: Dependent Is this a change from baseline?: Change from baseline, expected to last >3 days Feeding: Dependent Is this a change from baseline?: Change from baseline, expected to last >3 days Bathing: Dependent Is this a change from baseline?: Change from baseline, expected to last >3 days Toileting: Dependent Is this a change from baseline?: Change from baseline, expected to last >3days In/Out Bed: Dependent Is this a change from baseline?: Change from baseline, expected to last >3 days Walks in Home: Dependent Is this a change from baseline?: Change from baseline, expected to last >3 days Does the patient have difficulty walking or climbing stairs?: Yes Weakness of Legs: Both Weakness of Arms/Hands: Both  Permission Sought/Granted  Share Information with NAME: Jessica Key  Permission granted to share info w AGENCY: [facility] [phone]  Permission granted to share info w Relationship: Son  Permission granted to share info w Contact Information: [phone]  Emotional Assessment         Alcohol / Substance Use: Tobacco Use Psych Involvement: No (comment)  Admission diagnosis:  Sepsis secondary  to UTI (HCC) [A41.9, N39.0] Patient Active Problem List   Diagnosis Date Noted  . Sepsis secondary to UTI (HCC) 09/08/2020  . Acute CHF (congestive heart failure) (HCC) 09/08/2020  . NSTEMI (non-ST elevated myocardial infarction) (HCC) 09/08/2020  . Acute respiratory failure with hypoxia and hypercapnia (HCC) 09/08/2020  . Acute metabolic encephalopathy 09/08/2020   PCP:  Pcp, No Pharmacy:   [id number] - [place], [place] - [address] AT [facility] [address] [place] [address] Phone: [phone] Fax: [phone]     Social Determinants of Health (SDOH) Interventions    Readmission Risk Interventions No flowsheet data found.

## 2020-09-09 NOTE — Progress Notes (Signed)
PROGRESS NOTE    Jessica Key  HQP:[id number] DOB: 1926-11-08 DOA: 09/08/2020 PCP: Pcp, No    Chief Complaint  Patient presents with  . Altered Mental Status    Brief Narrative:  Jessica Key is a 86 y.o. female with medical history significant of hypertension; she has not been seen or follow lately with any medical provider or taking prescription medications.  Acute encephalopathy is preventing patient to participate with interview and information has been collected after discussing with patient's daughter, patient's son, review of EMS records and talking with ED provider.  Apparently patient was not feeling well and having some diarrhea since Monday; last line the family spoke with her was on 09/07/2020; at that moment she expressed feeling weak and not feeling well.  Patient's daughter visited her today and found her on the floor unresponsive.  No fever, no nausea, no vomiting, no chest pain, no hematuria, no melena, no hematochezia or any other complaints reported.  CT head was negative for any acute intracranial normalities in the ED.  ED Course: Patient work-up demonstrating acute respiratory failure with hypoxia and hypercapnia; chest x-ray with cardiomegaly, vascular congestion and pulmonary edema.  Elevated BNP and elevated troponin.  Urinalysis suggesting infection.  On admission patient was hypothermic, tachycardic, tachypneic and with hypoxia.  She has met sepsis criteria and received bolus of fluid and antibiotics after blood cultures and urine culture taken.  After finding x-ray changes and elevated BNP Lasix initiated.  TR[facility] has been contacted to place patient in the hospital for further evaluation and management.  Review of Systems: As per HPI otherwise all other systems reviewed and are negative.  Assessment & Plan: 1-Sepsis secondary to UTI [facility]) -follow final results for speciation and sensitivity  -continue IV antibiotics -continue supportive care  2-Acute CHF  (congestive heart failure) ([place]) -Echo pending -great response to IV diuretics -will continue to follow adjusted IV diuresis, daily weight and strict intake and output  3-NSTEMI (non-ST elevated myocardial infarction) (HCC) -continue aspirin and heparin drip -follow 2D echo -no B-blocker due to bradycardia -started on statins   4-Acute respiratory failure with hypoxia and hypercapnia (HCC) -significantly improved and currently off BIPAP -continue to wean off O2 supplementation. -continue diuresis  5-Acute metabolic encephalopathy -in the setting of sepsis, NSTEMI and hypercapnia -CT head neg for acute intracranial abnormalities -patient answering question appropriately at this time. -continue supportive care and re-orientaiton  -will advance diet and follow response   6-GI prophylaxis -continue PPI  7-DNR/DNI -confirmed with patient -wishes will be respected   8-hypothyroidism -started on synthroid  -repeat thyroid panel in 3 months.  9-HLD -LDL 124 -started on statins    DVT prophylaxis: heparin drip Code Status: DNR/DNI Family Communication: daughter at bedside  Disposition:   Status is: Inpatient  Dispo: The patient is from: home              Anticipated d/c is to: to be determine               Anticipated d/c date is: to be determine              Patient currently is not medically stable for discharge; still SOB, using 2L [place] supplementation and with fine crackles on exam. Cultures results still pending. Will continue IV antibiotics, continue IV lasix, follow daily weight and echo results. Will also continue IV heparin.     Consultants:   None    Procedures:  See below for x-ray reports   Antimicrobials:  Rocephin   Subjective: Afebrile, no chest pain, no nausea, no vomiting.  Off BiPAP, answering questions appropriately and in not distress. Overnight required atropine to be given due to bradycardia.   Objective: Vitals:   09/09/20 0800 09/09/20  0832 09/09/20 0900 09/09/20 1000  BP: (!) 145/93     Pulse: (!) 44  67 (!) 59  Resp: _0 Temp: 98.2 F (36.8 C) (!) 97.3 F (36.3 C) 98.2 F (36.8 C) 97.9 F (36.6 C)  TempSrc: Bladder Axillary    SpO2: 99%  100% 99%  Weight:        Intake/Output Summary (Last 24 hours) at 09/09/2020 1125 Last data filed at 09/09/2020 1025 Gross per 24 hour  Intake 665.42 ml  Output 4500 ml  Net -3834.58 ml   Filed Weights   09/08/20 1211 09/08/20 1837  Weight: 63 kg 61.1 kg    Examination:  General exam: afebrile, no CP, no nausea, no vomiting. Reports being thirsty, but not hungry. Oriented X2, not fully remembering what happened prior to admission. Patient is off BIPAP and using 2L [place]. Respiratory system: fine crackle sat the bases, not using accessory muscles, no wheezing present.  Cardiovascular system: rate controlled, no rubs or gallops, no JVD. Gastrointestinal system: Abdomen is nondistended, soft and nontender. No organomegaly or masses felt. Normal bowel sounds heard. Central nervous system: Alert and oriented. No focal neurological deficits. Extremities: no cyanosis, no clubbing  Skin: No petechiae.  Psychiatry: Mood & affect appropriate.     Data Reviewed: I have personally reviewed following labs and imaging studies  CBC: Recent Labs  Lab 09/08/20 1249 09/09/20 0936  WBC 7.8 10.6*  NEUTROABS 6.6  --   HGB 13.6 13.4  HCT 43.9 41.9  MCV 105.0* 102.4*  PLT 217 734    Basic Metabolic Panel: Recent Labs  Lab 09/08/20 1241 09/08/20 1249 09/09/20 0936  NA  --  140 141  K  --  4.3 3.2*  CL  --  101 100  CO2  --  29 31  GLUCOSE  --  117* 79  BUN  --  44* 35*  CREATININE  --  1.08* 0.92  CALCIUM  --  8.9 8.5*  MG 2.2  --   --     GFR: CrCl cannot be calculated (Unknown ideal weight.).  Liver Function Tests: Recent Labs  Lab 09/08/20 1249  AST 31  ALT 20  ALKPHOS 114  BILITOT 0.8  PROT 6.8  ALBUMIN 3.6    CBG: No results for input(s):  GLUCAP in the last 168 hours.   Recent Results (from the past 240 hour(s))  Blood Culture (routine x 2)     Status: None (Preliminary result)   Collection Time: 09/08/20 12:48 PM   Specimen: BLOOD  Result Value Ref Range Status   Specimen Description BLOOD BLOOD LEFT WRIST  Final   Special Requests   Final    BOTTLES DRAWN AEROBIC AND ANAEROBIC Blood Culture adequate volume   Culture   Final    NO GROWTH < 24 HOURS Performed at [facility], [address] [place], [place] [address]    Report Status PENDING  Incomplete  Resp Panel by RT-PCR (Flu A&B, Covid) Nasopharyngeal Swab     Status: None   Collection Time: 09/08/20  1:15 PM   Specimen: Nasopharyngeal Swab; Nasopharyngeal(NP) swabs in vial transport medium  Result Value Ref Range Status   SARS Coronavirus 2 by RT PCR NEGATIVE NEGATIVE Final  Comment: (NOTE) SARS-CoV-2 target nucleic acids are NOT DETECTED.  The SARS-CoV-2 RNA is generally detectable in upper respiratory specimens during the acute phase of infection. The lowest concentration of SARS-CoV-2 viral copies this assay can detect is 138 copies/mL. A negative result does not preclude SARS-Cov-2 infection and should not be used as the sole basis for treatment or other patient management decisions. A negative result may occur with  improper specimen collection/handling, submission of specimen other than nasopharyngeal swab, presence of viral mutation(s) within the areas targeted by this assay, and inadequate number of viral copies(<138 copies/mL). A negative result must be combined with clinical observations, patient history, and epidemiological information. The expected result is Negative.  Fact Sheet for Patients:  EntrepreneurPulse.com.au  Fact Sheet for Healthcare Providers:  IncredibleEmployment.be  This test is no t yet approved or cleared by the [place] FDA and  has been authorized for detection and/or diagnosis  of SARS-CoV-2 by FDA under an Emergency Use Authorization (EUA). This EUA will remain  in effect (meaning this test can be used) for the duration of the COVID-19 declaration under Section 564(b)(1) of the Act, 21 U.S.C.section 360bbb-3(b)(1), unless the authorization is terminated  or revoked sooner.       Influenza A by PCR NEGATIVE NEGATIVE Final   Influenza B by PCR NEGATIVE NEGATIVE Final    Comment: (NOTE) The Xpert Xpress SARS-CoV-2/FLU/RSV plus assay is intended as an aid in the diagnosis of influenza from Nasopharyngeal swab specimens and should not be used as a sole basis for treatment. Nasal washings and aspirates are unacceptable for Xpert Xpress SARS-CoV-2/FLU/RSV testing.  Fact Sheet for Patients: EntrepreneurPulse.com.au  Fact Sheet for Healthcare Providers: IncredibleEmployment.be  This test is not yet approved or cleared by the [place] FDA and has been authorized for detection and/or diagnosis of SARS-CoV-2 by FDA under an Emergency Use Authorization (EUA). This EUA will remain in effect (meaning this test can be used) for the duration of the COVID-19 declaration under Section 564(b)(1) of the Act, 21 U.S.C. section 360bbb-3(b)(1), unless the authorization is terminated or revoked.  Performed at [facility] : [facility], [address] [place], [place] [address]   Blood Culture (routine x 2)     Status: None (Preliminary result)   Collection Time: 09/08/20  1:16 PM   Specimen: BLOOD  Result Value Ref Range Status   Specimen Description BLOOD LEFT ANTECUBITAL  Final   Special Requests   Final    BOTTLES DRAWN AEROBIC AND ANAEROBIC Blood Culture adequate volume   Culture   Final    NO GROWTH < 24 HOURS Performed at [facility], [address] [place], [place] [address]    Report Status PENDING  Incomplete  MRSA PCR Screening     Status: None   Collection Time: 09/08/20  6:29 PM   Specimen: Nasal Mucosa; Nasopharyngeal    Result Value Ref Range Status   MRSA by PCR NEGATIVE NEGATIVE Final    Comment:        The GeneXpert MRSA Assay (FDA approved for NASAL specimens only), is one component of a comprehensive MRSA colonization surveillance program. It is not intended to diagnose MRSA infection nor to guide or monitor treatment for MRSA infections. Performed at [facility], [address], [place], [place] [address]      Radiology Studies: CT Head Wo Contrast  Result Date: 09/08/2020 CLINICAL DATA:  86 year old female found down. EXAM: CT HEAD WITHOUT CONTRAST TECHNIQUE: Contiguous axial images were obtained from the base of the skull  through the vertex without intravenous contrast. COMPARISON:  None. FINDINGS: Brain: Fairly symmetric and circumscribed bilateral hyperdense choroid plexus cysts (series 2, image 16). No intraventricular hemorrhage suspected. A no ventriculomegaly. No midline shift, mass effect, evidence of mass lesion, intracranial hemorrhage or evidence of cortically based acute infarction. Patchy bilateral white matter hypodensity. No cortical encephalomalacia identified. Vascular: Mild Calcified atherosclerosis at the skull base. No suspicious intracranial vascular hyperdensity. Skull: No fracture identified. Partially visible upper cervical spine degeneration. Sinuses/Orbits: Visualized paranasal sinuses and mastoids are well pneumatized. Other: No discrete orbit or scalp soft tissue injury identified. IMPRESSION: No acute intracranial abnormality or acute traumatic injury identified. Electronically Signed   By: Genevie Ann M.D.   On: 09/08/2020 14:06   DG Pelvis Portable  Result Date: 09/08/2020 CLINICAL DATA:  Status post fall. EXAM: PORTABLE PELVIS 1-2 VIEWS COMPARISON:  None. FINDINGS: Diminished exam detail due to patient positioning. The bones appear diffusely osteopenic. No fracture or dislocation identified. Degenerative disc disease noted within the imaged portions of the lumbar spine.  IMPRESSION: 1. Suboptimal exam due to patient positioning. No acute findings. If there is a continued concern for fracture recommend repeat imaging as patient's clinical condition tolerates. 2. Osteopenia. Electronically Signed   By: Kerby Moors M.D.   On: 09/08/2020 13:22   DG Chest Port 1 View  Result Date: 09/08/2020 CLINICAL DATA:  Questionable sepsis. Evaluate for pulmonary abnormality. EXAM: PORTABLE CHEST 1 VIEW COMPARISON:  None. FINDINGS: Cardiac enlargement and aortic atherosclerosis. Pulmonary vascular congestion. No frank edema, pleural effusion or airspace consolidation. IMPRESSION: Cardiac enlargement and pulmonary vascular congestion. Aortic Atherosclerosis (ICD10-I70.0). Electronically Signed   By: Kerby Moors M.D.   On: 09/08/2020 13:19    Scheduled Meds: . aspirin  300 mg Rectal Daily  . Chlorhexidine Gluconate Cloth  6 each Topical Daily  . furosemide  40 mg Intravenous Q12H  . sodium chloride flush  3 mL Intravenous Q12H   Continuous Infusions: . sodium chloride    . cefTRIAXone (ROCEPHIN)  IV    . heparin 750 Units/hr (09/09/20 0800)     LOS: 1 day    Time spent: 35 minutes    Barton Dubois, MD [facility]   To contact the attending provider between 7A-7P or the covering provider during after hours 7P-7A, please log into the web site [email or web address] and access using universal [place] password for that web site. If you do not have the password, please call the hospital operator.  09/09/2020, 11:25 AM

## 2020-09-09 NOTE — Progress Notes (Signed)
ANTICOAGULATION CONSULT NOTE - Follow Up Consult  Pharmacy Consult for heparin Indication: chest pain/ACS  No Known Allergies  Patient Measurements: Weight: 61.1 kg (134 lb 11.2 oz) Heparin Dosing Weight: 61.1 kg  Vital Signs: Temp: 98.6 F (37 C) (11/27 0000) Temp Source: Bladder (11/26 2000) BP: 151/118 (11/27 0000) Pulse Rate: 78 (11/27 0000)  Labs: Recent Labs    09/08/20 1249 09/08/20 1428 09/09/20 0020  HGB 13.6  --   --   HCT 43.9  --   --   PLT 217  --   --   APTT 32  --   --   LABPROT 13.4  --   --   INR 1.1  --   --   HEPARINUNFRC  --   --  0.48  CREATININE 1.08*  --   --   CKTOTAL 180  --   --   TROPONINIHS 193* 235*  --     CrCl cannot be calculated (Unknown ideal weight.).  Assessment: 86 yo lady on heparin for CP.  Heparin level 0.48 units/ml.  No complications noted Goal of Therapy:  Heparin level 0.3-0.7 units/ml Monitor platelets by anticoagulation protocol: Yes   Plan:  Continue heparin at 750 units/hr. F/u am labs  Jessica Key 09/09/2020,12:53 AM

## 2020-09-09 NOTE — Progress Notes (Signed)
ANTICOAGULATION CONSULT NOTE - Follow Up Consult  Pharmacy Consult for heparin Indication: chest pain/ACS  No Known Allergies  Patient Measurements: Weight: 61.1 kg (134 lb 11.2 oz) Heparin Dosing Weight: 61.1 kg  Vital Signs: Temp: 97.9 F (36.6 C) (11/27 1000) Temp Source: Axillary (11/27 0832) BP: 145/93 (11/27 0800) Pulse Rate: 59 (11/27 1000)  Labs: Recent Labs    09/08/20 1249 09/08/20 1428 09/09/20 0020 09/09/20 0936  HGB 13.6  --   --  13.4  HCT 43.9  --   --  41.9  PLT 217  --   --  195  APTT 32  --   --   --   LABPROT 13.4  --   --   --   INR 1.1  --   --   --   HEPARINUNFRC  --   --  0.48 0.68  CREATININE 1.08*  --   --  0.92  CKTOTAL 180  --   --   --   TROPONINIHS 193* 235*  --   --     CrCl cannot be calculated (Unknown ideal weight.).  Assessment: 86 yo lady on heparin for CP.  Heparin level 0.68 units/ml.  No complications noted Goal of Therapy:  Heparin level 0.3-0.7 units/ml Monitor platelets by anticoagulation protocol: Yes   Plan:  Continue heparin at 750 units/hr. F/u am labs  Gerre Pebbles Jaylinn Hellenbrand 09/09/2020,1:06 PM

## 2020-09-10 DIAGNOSIS — A419 Sepsis, unspecified organism: Secondary | ICD-10-CM | POA: Diagnosis not present

## 2020-09-10 DIAGNOSIS — N390 Urinary tract infection, site not specified: Secondary | ICD-10-CM | POA: Diagnosis not present

## 2020-09-10 DIAGNOSIS — I509 Heart failure, unspecified: Secondary | ICD-10-CM | POA: Diagnosis not present

## 2020-09-10 DIAGNOSIS — G9341 Metabolic encephalopathy: Secondary | ICD-10-CM | POA: Diagnosis not present

## 2020-09-10 LAB — URINE CULTURE: Culture: NO GROWTH

## 2020-09-10 LAB — CBC
HCT: 40.4 % (ref 36.0–46.0)
Hemoglobin: 12.4 g/dL (ref 12.0–15.0)
MCH: 32 pg (ref 26.0–34.0)
MCHC: 30.7 g/dL (ref 30.0–36.0)
MCV: 104.1 fL — ABNORMAL HIGH (ref 80.0–100.0)
Platelets: 205 10*3/uL (ref 150–400)
RBC: 3.88 MIL/uL (ref 3.87–5.11)
RDW: 15.6 % — ABNORMAL HIGH (ref 11.5–15.5)
WBC: 9.7 10*3/uL (ref 4.0–10.5)
nRBC: 0 % (ref 0.0–0.2)

## 2020-09-10 LAB — BASIC METABOLIC PANEL
Anion gap: 9 (ref 5–15)
BUN: 31 mg/dL — ABNORMAL HIGH (ref 8–23)
CO2: 35 mmol/L — ABNORMAL HIGH (ref 22–32)
Calcium: 8.5 mg/dL — ABNORMAL LOW (ref 8.9–10.3)
Chloride: 96 mmol/L — ABNORMAL LOW (ref 98–111)
Creatinine, Ser: 0.78 mg/dL (ref 0.44–1.00)
GFR, Estimated: >60 mL/min (ref >60)
Glucose, Bld: 82 mg/dL (ref 70–99)
Potassium: 3.2 mmol/L — ABNORMAL LOW (ref 3.5–5.1)
Sodium: 140 mmol/L (ref 135–145)

## 2020-09-10 LAB — HEPARIN LEVEL (UNFRACTIONATED): Heparin Unfractionated: 0.3 IU/mL (ref 0.30–0.70)

## 2020-09-10 MED ORDER — POTASSIUM CHLORIDE CRYS ER 20 MEQ PO TBCR
40.0000 meq | EXTENDED_RELEASE_TABLET | Freq: Once | ORAL | Status: DC
  Filled 2020-09-10: qty 2

## 2020-09-10 MED ORDER — FUROSEMIDE 10 MG/ML IJ SOLN
20.0000 mg | Freq: Two times a day (BID) | INTRAMUSCULAR | Status: DC
  Administered 2020-09-10 – 2020-09-12 (×5): 20 mg via INTRAVENOUS
  Filled 2020-09-10 (×5): qty 2

## 2020-09-10 MED ORDER — ENOXAPARIN SODIUM 40 MG/0.4ML ~~LOC~~ SOLN
40.0000 mg | SUBCUTANEOUS | Status: DC
  Administered 2020-09-10 – 2020-09-13 (×4): 40 mg via SUBCUTANEOUS
  Filled 2020-09-10 (×4): qty 0.4

## 2020-09-10 MED ORDER — POTASSIUM CHLORIDE 20 MEQ PO PACK
40.0000 meq | PACK | Freq: Once | ORAL | Status: AC
  Administered 2020-09-10: 40 meq via ORAL
  Filled 2020-09-10: qty 2

## 2020-09-10 NOTE — Progress Notes (Signed)
PROGRESS NOTE    Jessica Key  RCB:[id number] DOB: Oct 30, 1926 DOA: 09/08/2020 PCP: Pcp, No    Chief Complaint  Patient presents with   Altered Mental Status    Brief Narrative:  Jessica Key is a 86 y.o. female with medical history significant of hypertension; she has not been seen or follow lately with any medical provider or taking prescription medications.  Acute encephalopathy is preventing patient to participate with interview and information has been collected after discussing with patient's daughter, patient's son, review of EMS records and talking with ED provider.  Apparently patient was not feeling well and having some diarrhea since Monday; last line the family spoke with her was on 09/07/2020; at that moment she expressed feeling weak and not feeling well.  Patient's daughter visited her today and found her on the floor unresponsive.  No fever, no nausea, no vomiting, no chest pain, no hematuria, no melena, no hematochezia or any other complaints reported.  CT head was negative for any acute intracranial normalities in the ED.  ED Course: Patient work-up demonstrating acute respiratory failure with hypoxia and hypercapnia; chest x-ray with cardiomegaly, vascular congestion and pulmonary edema.  Elevated BNP and elevated troponin.  Urinalysis suggesting infection.  On admission patient was hypothermic, tachycardic, tachypneic and with hypoxia.  She has met sepsis criteria and received bolus of fluid and antibiotics after blood cultures and urine culture taken.  After finding x-ray changes and elevated BNP Lasix initiated.  TRH has been contacted to place patient in the hospital for further evaluation and management.  Review of Systems: As per HPI otherwise all other systems reviewed and are negative.  Assessment & Plan: 1-Sepsis secondary to UTI [facility]) -follow final results for speciation and sensitivity  -continue IV antibiotics -continue supportive care  2-Acute right side CHF  (congestive heart failure) ([place]) -Echo: Demonstrating preserved ejection fraction and no wall motion abnormalities on the left side. -Patient with right-sided failure and right ventricle hypertrophy with elevated pulmonary hypertension -Continue diuresis, low-sodium diet and most likely low-dose sildenafil at time of discharge. -Continue clinical response.  3-presumed NSTEMI (non-ST elevated myocardial infarction) ([place]) -Will continue aspirin and statins -No beta-blocker due to bradycardia -Based on echo results and discussion with cardiology service will discontinue heparin drip.  4-Acute respiratory failure with hypoxia and hypercapnia ([facility]) -significantly improved and currently off BIPAP now for over 36 hours with good saturation on 3-4 L nasal cannula supplementation. -continue to wean off O2 supplementation as tolerated. -continue diuresis, dose has been adjusted.  5-Acute metabolic encephalopathy -in the setting of sepsis and hypercapnia -Echo results and case discussed over the phone with cardiology service (Dr. Sallyanne Kuster), NSTEMI less likely and with concern for pulmonary embolism due to right heart hypertrophy.   -Heparin has been discontinue -Continue treatment with IV antibiotics for UTI/sepsis -D-dimer was checked to rule out significant pulmonary embolism-negative -Will use just DVT prophylaxis. -CT head was negative for acute intracranial normalities -TSH found to be abnormal; treatment as mentioned below. -Continue supportive care and constant reorientation.  6-GI prophylaxis -continue PPI  7-DNR/DNI -confirmed with patient -wishes will be respected   8-hypothyroidism -started on synthroid 50 mcg daily -repeat thyroid panel in 3 months.  9-HLD -LDL 124 -Continue statins.   DVT prophylaxis: heparin drip Code Status: DNR/DNI Family Communication: No family at bedside. Disposition:   Status is: Inpatient  Dispo: The patient is from: home               Anticipated d/c is to: to  be determine               Anticipated d/c date is: to be determine              Patient currently is not medically stable for discharge; still mildly SOB, using 3-4 L Powers supplementation and with decreased breath sounds at her lung bases during examination.  Will continue current IV antibiotics, continue IV lasix (dose adjusted in the setting of soft blood pressure), follow daily weight and strict I's and O's.. Will discontinue IV heparin; use only DVT prophylaxis.     Consultants:   Cardiology service (curbside; Dr. Sallyanne Kuster).   Procedures:  See below for x-ray reports  2D echo: Preserved ejection fraction, no wall motion normalities appreciated on the left ventricle.  Patient with significantly dilated right ventricle and elevated pulmonary artery pressure.  Antimicrobials:  Rocephin   Subjective: Afebrile, no chest pain, no nausea, no vomiting.  Patient has been off BiPAP for 36 hours and well-tolerated so far.  Using 3-4 L nasal cannula supplementation.  Mild hematuria reported by nursing staff.  Objective: Vitals:   09/10/20 1200 09/10/20 1300 09/10/20 1400 09/10/20 1500  BP: (!) 121/49 (!) 99/48 (!) 106/52 (!) 100/55  Pulse: 72 73 68 60  Resp: _0 Temp: 98.4 F (36.9 C) 98.6 F (37 C) 98.6 F (37 C) 99.3 F (37.4 C)  TempSrc: Bladder     SpO2: 94% 98% 99% 91%  Weight:        Intake/Output Summary (Last 24 hours) at 09/10/2020 1535 Last data filed at 09/10/2020 1300 Gross per 24 hour  Intake 737.81 ml  Output 2025 ml  Net -1287.19 ml   Filed Weights   09/08/20 1211 09/08/20 1837  Weight: 63 kg 61.1 kg    Examination: General exam: Alert, awake, oriented x2 and following commands appropriately.  Denies chest pain, no nausea, no vomiting.  Tolerating clear liquid diet without problems. Respiratory system: Scattered rhonchi appreciated; decreased breath sounds at the bases.  No frank crackles, no wheezing. Cardiovascular system:  Rate controlled, no rubs, no gallops, no JVD on exam. Gastrointestinal system: Abdomen is nondistended, soft and nontender. No organomegaly or masses felt. Normal bowel sounds heard. Central nervous system: No focal neurological deficits.  Moving 4 limbs spontaneously. Extremities: No cyanosis or clubbing.  No lower extremity edema appreciated. Skin: No rashes or petechiae; big left upper extremity hematoma where heparin drip was infusing. Psychiatry: Mood & affect appropriate.    Data Reviewed: I have personally reviewed following labs and imaging studies  CBC: Recent Labs  Lab 09/08/20 1249 09/09/20 0936 09/10/20 0449  WBC 7.8 10.6* 9.7  NEUTROABS 6.6  --   --   HGB 13.6 13.4 12.4  HCT 43.9 41.9 40.4  MCV 105.0* 102.4* 104.1*  PLT 217 195 767    Basic Metabolic Panel: Recent Labs  Lab 09/08/20 1241 09/08/20 1249 09/09/20 0936 09/10/20 0449  NA  --  140 141 140  K  --  4.3 3.2* 3.2*  CL  --  101 100 96*  CO2  --  29 31 35*  GLUCOSE  --  117* 79 82  BUN  --  44* 35* 31*  CREATININE  --  1.08* 0.92 0.78  CALCIUM  --  8.9 8.5* 8.5*  MG 2.2  --   --   --     GFR: CrCl cannot be calculated (Unknown ideal weight.).  Liver Function Tests: Recent Labs  Lab 09/08/20  1249  AST 31  ALT 20  ALKPHOS 114  BILITOT 0.8  PROT 6.8  ALBUMIN 3.6    CBG: No results for input(s): GLUCAP in the last 168 hours.   Recent Results (from the past 240 hour(s))  Blood Culture (routine x 2)     Status: None (Preliminary result)   Collection Time: 09/08/20 12:48 PM   Specimen: BLOOD  Result Value Ref Range Status   Specimen Description BLOOD BLOOD LEFT WRIST  Final   Special Requests   Final    BOTTLES DRAWN AEROBIC AND ANAEROBIC Blood Culture adequate volume   Culture   Final    NO GROWTH 2 DAYS Performed at [facility], [address], [place], [place] [address]    Report Status PENDING  Incomplete  Urine culture     Status: None   Collection Time: 09/08/20  1:15 PM    Specimen: Urine, Catheterized  Result Value Ref Range Status   Specimen Description   Final    URINE, CATHETERIZED Performed at [facility], [address], [place], [place] [address]    Special Requests   Final    NONE Performed at [facility], [address] [place], [place] [address]    Culture   Final    NO GROWTH Performed at [facility], [place] [address] [place], [place] [address]    Report Status 09/10/2020 FINAL  Final  Resp Panel by RT-PCR (Flu A&B, Covid) Nasopharyngeal Swab     Status: None   Collection Time: 09/08/20  1:15 PM   Specimen: Nasopharyngeal Swab; Nasopharyngeal(NP) swabs in vial transport medium  Result Value Ref Range Status   SARS Coronavirus 2 by RT PCR NEGATIVE NEGATIVE Final    Comment: (NOTE) SARS-CoV-2 target nucleic acids are NOT DETECTED.  The SARS-CoV-2 RNA is generally detectable in upper respiratory specimens during the acute phase of infection. The lowest concentration of SARS-CoV-2 viral copies this assay can detect is 138 copies/mL. A negative result does not preclude SARS-Cov-2 infection and should not be used as the sole basis for treatment or other patient management decisions. A negative result may occur with  improper specimen collection/handling, submission of specimen other than nasopharyngeal swab, presence of viral mutation(s) within the areas targeted by this assay, and inadequate number of viral copies(<138 copies/mL). A negative result must be combined with clinical observations, patient history, and epidemiological information. The expected result is Negative.  Fact Sheet for Patients:  EntrepreneurPulse.com.au  Fact Sheet for Healthcare Providers:  IncredibleEmployment.be  This test is no t yet approved or cleared by the [place] FDA and  has been authorized for detection and/or diagnosis of SARS-CoV-2 by FDA under an Emergency Use Authorization (EUA). This EUA will remain    in effect (meaning this test can be used) for the duration of the COVID-19 declaration under Section 564(b)(1) of the Act, 21 U.S.C.section 360bbb-3(b)(1), unless the authorization is terminated  or revoked sooner.       Influenza A by PCR NEGATIVE NEGATIVE Final   Influenza B by PCR NEGATIVE NEGATIVE Final    Comment: (NOTE) The Xpert Xpress SARS-CoV-2/FLU/RSV plus assay is intended as an aid in the diagnosis of influenza from Nasopharyngeal swab specimens and should not be used as a sole basis for treatment. Nasal washings and aspirates are unacceptable for Xpert Xpress SARS-CoV-2/FLU/RSV testing.  Fact Sheet for Patients: EntrepreneurPulse.com.au  Fact Sheet for Healthcare Providers: IncredibleEmployment.be  This test is not yet approved or cleared by the [place]  FDA and has been authorized for detection and/or diagnosis of SARS-CoV-2 by FDA under an Emergency Use Authorization (EUA). This EUA will remain in effect (meaning this test can be used) for the duration of the COVID-19 declaration under Section 564(b)(1) of the Act, 21 U.S.C. section 360bbb-3(b)(1), unless the authorization is terminated or revoked.  Performed at [facility], [address], [place], [place] [address]   Blood Culture (routine x 2)     Status: None (Preliminary result)   Collection Time: 09/08/20  1:16 PM   Specimen: BLOOD  Result Value Ref Range Status   Specimen Description BLOOD LEFT ANTECUBITAL  Final   Special Requests   Final    BOTTLES DRAWN AEROBIC AND ANAEROBIC Blood Culture adequate volume   Culture   Final    NO GROWTH 2 DAYS Performed at [facility], [address], [place], [place] [address]    Report Status PENDING  Incomplete  MRSA PCR Screening     Status: None   Collection Time: 09/08/20  6:29 PM   Specimen: Nasal Mucosa; Nasopharyngeal  Result Value Ref Range Status   MRSA by PCR NEGATIVE NEGATIVE Final    Comment:        The  GeneXpert MRSA Assay (FDA approved for NASAL specimens only), is one component of a comprehensive MRSA colonization surveillance program. It is not intended to diagnose MRSA infection nor to guide or monitor treatment for MRSA infections. Performed at [facility], [address] [place], [place] [address]      Radiology Studies: ECHOCARDIOGRAM COMPLETE  Result Date: 09/09/2020    ECHOCARDIOGRAM REPORT   Patient Name:   TINIKA BUCKNAM Date of Exam: 09/09/2020 Medical Rec #:  [id number]  Height: Accession #:    [id number] Weight:       134.7 lb Date of Birth:  [birth year]  BSA:          1.552 m Patient Age:    70 years   BP:           145/93 mmHg Patient Gender: F          HR:           47 bpm. Exam Location:  [facility] Procedure: 2D Echo, Cardiac Doppler and Color Doppler Indications:    NSTEMI I21.4  History:        Patient has no prior history of Echocardiogram examinations.                 CHF; NSTEMI I21.4. Acute respiratory failure with hypoxia and                 hypercapnia.  Sonographer:    Alvino Chapel RCS Referring Phys: [place]  1. Left ventricular ejection fraction, by estimation, is 65 to 70%. The left ventricle has normal function. The left ventricle has no regional wall motion abnormalities. Left ventricular diastolic parameters are consistent with Grade I diastolic dysfunction (impaired relaxation). There is the interventricular septum is flattened in systole and diastole, consistent with right ventricular pressure and volume overload.  2. Right ventricular systolic function is severely reduced. The right ventricular size is severely enlarged. There is severely elevated pulmonary artery systolic pressure.  3. Left atrial size was mildly dilated.  4. Right atrial size was mildly dilated.  5. A small pericardial effusion is present. The pericardial effusion is circumferential.  6. The mitral valve is normal in structure. Trivial mitral valve regurgitation.  7.  Tricuspid valve regurgitation is moderate  to severe.  8. The aortic valve is tricuspid. Aortic valve regurgitation is mild to moderate. No aortic stenosis is present.  9. Aortic dilatation noted. There is mild dilatation of the aortic root, measuring 39 mm. 10. The inferior vena cava is dilated in size with <50% respiratory variability, suggesting right atrial pressure of 15 mmHg. Comparison(s): Findings are consistent with acute on chronic cor pulmonale. Consider pulmonary embolism superimposed on chronic pulmonary HTN due to COPD or OSA. FINDINGS  Left Ventricle: Left ventricular ejection fraction, by estimation, is 65 to 70%. The left ventricle has normal function. The left ventricle has no regional wall motion abnormalities. The left ventricular internal cavity size was normal in size. There is  no left ventricular hypertrophy. The interventricular septum is flattened in systole and diastole, consistent with right ventricular pressure and volume overload. Left ventricular diastolic parameters are consistent with Grade I diastolic dysfunction (impaired relaxation). Indeterminate filling pressures. Right Ventricle: The right ventricular size is severely enlarged. No increase in right ventricular wall thickness. Right ventricular systolic function is severely reduced. There is severely elevated pulmonary artery systolic pressure. The tricuspid regurgitant velocity is 3.88 m/s, and with an assumed right atrial pressure of 15 mmHg, the estimated right ventricular systolic pressure is 48.1 mmHg. Left Atrium: Left atrial size was mildly dilated. Right Atrium: Right atrial size was mildly dilated. Pericardium: A small pericardial effusion is present. The pericardial effusion is circumferential. Mitral Valve: The mitral valve is normal in structure. Trivial mitral valve regurgitation. Tricuspid Valve: The tricuspid valve is normal in structure. Tricuspid valve regurgitation is moderate to severe. Aortic Valve: The aortic  valve is tricuspid. Aortic valve regurgitation is mild to moderate. No aortic stenosis is present. Pulmonic Valve: The pulmonic valve was grossly normal. Pulmonic valve regurgitation is mild. Aorta: Aortic dilatation noted. There is mild dilatation of the aortic root, measuring 39 mm. Venous: The inferior vena cava is dilated in size with less than 50% respiratory variability, suggesting right atrial pressure of 15 mmHg. IAS/Shunts: No atrial level shunt detected by color flow Doppler.  LEFT VENTRICLE PLAX 2D LVIDd:         4.80 cm  Diastology LVIDs:         2.50 cm  LV e' medial:    4.13 cm/s LV PW:         1.00 cm  LV E/e' medial:  16.2 LV IVS:        1.10 cm  LV e' lateral:   7.18 cm/s LVOT diam:     1.80 cm  LV E/e' lateral: 9.3 LV SV:         68 LV SV Index:   44 LVOT Area:     2.54 cm  RIGHT VENTRICLE RV S prime:     13.60 cm/s TAPSE (M-mode): 2.1 cm LEFT ATRIUM           Index       RIGHT ATRIUM           Index LA diam:      4.00 cm 2.58 cm/m  RA Area:     20.50 cm LA Vol (A4C): 41.5 ml 26.75 ml/m RA Volume:   59.70 ml  38.48 ml/m  AORTIC VALVE LVOT Vmax:   134.50 cm/s LVOT Vmean:  82.050 cm/s LVOT VTI:    0.268 m  AORTA Ao Root diam: 3.90 cm MITRAL VALVE                TRICUSPID VALVE MV Area (PHT):  2.78 cm     TR Peak grad:   60.2 mmHg MV Decel Time: 273 msec     TR Vmax:        388.00 cm/s MV E velocity: 66.75 cm/s MV A velocity: 116.00 cm/s  SHUNTS MV E/A ratio:  0.58         Systemic VTI:  0.27 m                             Systemic Diam: 1.80 cm Mihai Croitoru MD Electronically signed by Sanda Klein MD Signature Date/Time: 09/09/2020/1:49:30 PM    Final     Scheduled Meds:  aspirin EC  81 mg Oral Daily   Chlorhexidine Gluconate Cloth  6 each Topical Daily   enoxaparin (LOVENOX) injection  40 mg Subcutaneous Q24H   furosemide  20 mg Intravenous Q12H   levothyroxine  50 mcg Oral Q0600   pantoprazole  40 mg Oral Daily   simvastatin  20 mg Oral q1800   sodium chloride flush  3 mL  Intravenous Q12H   Continuous Infusions:  sodium chloride     cefTRIAXone (ROCEPHIN)  IV Stopped (09/10/20 1243)     LOS: 2 days    Time spent: 35 minutes    Barton Dubois, MD [facility]   To contact the attending provider between 7A-7P or the covering provider during after hours 7P-7A, please log into the web site [email or web address] and access using universal [place] password for that web site. If you do not have the password, please call the hospital operator.  09/10/2020, 3:35 PM

## 2020-09-10 NOTE — TOC Progression Note (Signed)
Transition of Care [facility]) - Progression Note    Patient Details  Name: Jessica Key MRN: [id number] Date of Birth: 08/09/1927  [facility] of [facility]) CM/SW Contact  Barry Brunner, LCSW Phone Number: 09/10/2020, 2:40 PM  Clinical Narrative:    CSW discussed HH with [facility] for patient. [facility] agreeable to provide services. Patient's POA agreeable to [facility], but would like to ensure patient is agreeable once fully oriented. Per previous note, patient's POA reported that patient has been living alone for the past 20+ years and would most likely not be agreeable to SNF due to preferring to live independently. TOC to follow.    Expected Discharge Plan: Home w Home Health Services Barriers to Discharge: Continued Medical Work up  Expected Discharge Plan and Services Expected Discharge Plan: Home w Home Health Services       Living arrangements for the past 2 months: Single Family Home                                       Social Determinants of Health (SDOH) Interventions    Readmission Risk Interventions No flowsheet data found.

## 2020-09-10 NOTE — Progress Notes (Signed)
ANTICOAGULATION CONSULT NOTE - Follow Up Consult  Pharmacy Consult for heparin Indication: chest pain/ACS  No Known Allergies  Patient Measurements: Weight: 61.1 kg (134 lb 11.2 oz) Heparin Dosing Weight: 61.1 kg  Vital Signs: Temp: 97.7 F (36.5 C) (11/28 0754) Temp Source: Bladder (11/28 0734) BP: 97/58 (11/28 0754) Pulse Rate: 66 (11/28 0754)  Labs: Recent Labs    09/08/20 1249 09/08/20 1249 09/08/20 1428 09/09/20 0020 09/09/20 0936 09/10/20 0449  HGB 13.6   < >  --   --  13.4 12.4  HCT 43.9  --   --   --  41.9 40.4  PLT 217  --   --   --  195 205  APTT 32  --   --   --   --   --   LABPROT 13.4  --   --   --   --   --   INR 1.1  --   --   --   --   --   HEPARINUNFRC  --   --   --  0.48 0.68 0.30  CREATININE 1.08*  --   --   --  0.92 0.78  CKTOTAL 180  --   --   --   --   --   TROPONINIHS 193*  --  235*  --   --   --    < > = values in this interval not displayed.    CrCl cannot be calculated (Unknown ideal weight.).  Assessment: 86 yo lady on heparin for CP.  Heparin level 0.30 units/ml.  No complications noted  Goal of Therapy:  Heparin level 0.3-0.7 units/ml Monitor platelets by anticoagulation protocol: Yes   Plan:  Continue heparin at 750 units/hr. F/u am labs  Gerre Pebbles [place] 09/10/2020,8:18 AM

## 2020-09-11 DIAGNOSIS — N390 Urinary tract infection, site not specified: Secondary | ICD-10-CM | POA: Diagnosis not present

## 2020-09-11 DIAGNOSIS — I509 Heart failure, unspecified: Secondary | ICD-10-CM | POA: Diagnosis not present

## 2020-09-11 DIAGNOSIS — G9341 Metabolic encephalopathy: Secondary | ICD-10-CM | POA: Diagnosis not present

## 2020-09-11 DIAGNOSIS — A419 Sepsis, unspecified organism: Secondary | ICD-10-CM | POA: Diagnosis not present

## 2020-09-11 LAB — CBC
HCT: 39.3 % (ref 36.0–46.0)
Hemoglobin: 12 g/dL (ref 12.0–15.0)
MCH: 32.2 pg (ref 26.0–34.0)
MCHC: 30.5 g/dL (ref 30.0–36.0)
MCV: 105.4 fL — ABNORMAL HIGH (ref 80.0–100.0)
Platelets: 201 10*3/uL (ref 150–400)
RBC: 3.73 MIL/uL — ABNORMAL LOW (ref 3.87–5.11)
RDW: 15.5 % (ref 11.5–15.5)
WBC: 8 10*3/uL (ref 4.0–10.5)
nRBC: 0 % (ref 0.0–0.2)

## 2020-09-11 LAB — BASIC METABOLIC PANEL
Anion gap: 8 (ref 5–15)
BUN: 24 mg/dL — ABNORMAL HIGH (ref 8–23)
CO2: 36 mmol/L — ABNORMAL HIGH (ref 22–32)
Calcium: 8.7 mg/dL — ABNORMAL LOW (ref 8.9–10.3)
Chloride: 94 mmol/L — ABNORMAL LOW (ref 98–111)
Creatinine, Ser: 0.59 mg/dL (ref 0.44–1.00)
GFR, Estimated: >60 mL/min (ref >60)
Glucose, Bld: 83 mg/dL (ref 70–99)
Potassium: 4 mmol/L (ref 3.5–5.1)
Sodium: 138 mmol/L (ref 135–145)

## 2020-09-11 NOTE — Progress Notes (Signed)
PROGRESS NOTE    Jessica Key  GHW:[id number] DOB: 05-22-1927 DOA: 09/08/2020 PCP: Pcp, No    Chief Complaint  Patient presents with  . Altered Mental Status    Brief Narrative:  Jessica Key is a 86 y.o. female with medical history significant of hypertension; she has not been seen or follow lately with any medical provider or taking prescription medications.  Acute encephalopathy is preventing patient to participate with interview and information has been collected after discussing with patient's daughter, patient's son, review of EMS records and talking with ED provider.  Apparently patient was not feeling well and having some diarrhea since Monday; last line the family spoke with her was on 09/07/2020; at that moment she expressed feeling weak and not feeling well.  Patient's daughter visited her today and found her on the floor unresponsive.  No fever, no nausea, no vomiting, no chest pain, no hematuria, no melena, no hematochezia or any other complaints reported.  CT head was negative for any acute intracranial normalities in the ED.  ED Course: Patient work-up demonstrating acute respiratory failure with hypoxia and hypercapnia; chest x-ray with cardiomegaly, vascular congestion and pulmonary edema.  Elevated BNP and elevated troponin.  Urinalysis suggesting infection.  On admission patient was hypothermic, tachycardic, tachypneic and with hypoxia.  She has met sepsis criteria and received bolus of fluid and antibiotics after blood cultures and urine culture taken.  After finding x-ray changes and elevated BNP Lasix initiated.  TRH has been contacted to place patient in the hospital for further evaluation and management.  Review of Systems: As per HPI otherwise all other systems reviewed and are negative.  Assessment & Plan: 1-Sepsis secondary to UTI ([place]) -No growth appreciated on urine cultures -continue IV antibiotics x1 more day to complete a total of 3 empiric  management. -continue supportive care maintain adequate hydration.  2-Acute right side CHF (congestive heart failure) ([place]) -Echo: Demonstrating preserved ejection fraction and no wall motion abnormalities on the left side. -Patient with right-sided failure and right ventricle hypertrophy with elevated pulmonary hypertension -Continue diuresis, low-sodium diet and most likely low-dose sildenafil at time of discharge. -Continue to follow clinical response.  3-presumed NSTEMI (non-ST elevated myocardial infarction) ([place]) -Will continue aspirin and statins -No beta-blocker due to bradycardia -Based on echo results and discussion with cardiology service will discontinue heparin drip.  4-Acute respiratory failure with hypoxia and hypercapnia ([facility]) -Significantly improved overall; has not required the use of BiPAP and at this moment good oxygen saturation on 3-4 L nasal cannula supplementation as maintain. -Continue to wean oxygen supplementation as tolerated -Continue diuresis.  5-Acute metabolic encephalopathy -in the setting of sepsis and hypercapnia -Echo results and case discussed over the phone with cardiology service (Dr. Sallyanne Kuster), NSTEMI less likely and with concern for pulmonary embolism due to right heart hypertrophy.   -Heparin has been discontinue -Continue treatment with IV antibiotics for UTI/sepsis -D-dimer was checked to rule out significant pulmonary embolism and -negative -Continue to use just DVT prophylaxis. -CT head was negative for acute intracranial normalities -TSH found to be abnormal; treatment as mentioned below. -Continue supportive care and constant reorientation. -Patient mentation continued to improve; oriented x2 and following commands at this moment.  6-GI prophylaxis -continue PPI  7-DNR/DNI -confirmed with patient -wishes will be respected   8-hypothyroidism -started on synthroid 50 mcg daily -repeat thyroid panel in 3 months.  9-HLD -LDL  124 -Continue statins.  10-physical deconditioning -PT has seen patient and recommending skilled nursing facility at discharge for rehabilitation  and further care.   DVT prophylaxis: heparin drip Code Status: DNR/DNI Family Communication: No family at bedside. Disposition:   Status is: Inpatient  Dispo: The patient is from: home              Anticipated d/c is to: Skilled nursing facility              Anticipated d/c date is: to be determine              Patient currently is not medically stable for discharge; still mildly SOB, using 3-4 L [place] supplementation and with decreased breath sounds at her lung bases during examination.  Will continue current IV antibiotics, continue IV lasix and continue to follow daily weight and strict I's and O's.  Physical therapy has seen patient and recommend skilled nursing facility for further care and rehabilitation.     Consultants:   Cardiology service (curbside; Dr. Sallyanne Kuster).   Procedures:  See below for x-ray reports  2D echo: Preserved ejection fraction, no wall motion normalities appreciated on the left ventricle.  Patient with significantly dilated right ventricle and elevated pulmonary artery pressure.  Antimicrobials:  Rocephin   Subjective: No fever, no chest pain, no nausea or vomiting.  Good saturation on 3-4 L nasal cannula supplementation.  Significantly weak and deconditioned.  Reports not to have appetite.  Objective: Vitals:   09/11/20 1400 09/11/20 1500 09/11/20 1600 09/11/20 1624  BP: (!) 119/43 (!) 118/50 (!) 125/93   Pulse: 80 74 72 75  Resp: (!) $RemoveB'25 16 20 17  'cDGrfXNA$ Temp: 98.6 F (37 C) 98.4 F (36.9 C) 99 F (37.2 C) 98 F (36.7 C)  TempSrc:   Bladder Oral  SpO2: (!) 84% 98% 96% 94%  Weight:        Intake/Output Summary (Last 24 hours) at 09/11/2020 1743 Last data filed at 09/11/2020 1100 Gross per 24 hour  Intake 120 ml  Output 1290 ml  Net -1170 ml   Filed Weights   09/08/20 1211 09/08/20 1837  Weight:  63 kg 61.1 kg    Examination: General exam: Alert, awake, oriented x 2; following commands appropriately and expressing no chest pain or abdominal pain.  Patient also denies nausea vomiting.  Using 4 L nasal cannula supplementation. Respiratory system: Decreased breath sounds at the bases, no wheezing, positive scattered rhonchi.  No using accessory muscle. Cardiovascular system: No rubs, no gallops, no JVD on exam.  Rate controlled. Gastrointestinal system: Abdomen is nondistended, soft and nontender. No organomegaly or masses felt. Normal bowel sounds heard. Central nervous system: Alert and oriented. No focal neurological deficits. Extremities: No cyanosis or clubbing.  No edema appreciated. Skin: No rashes, no petechiae; improvement appreciated in patient's basic left upper extremity hematoma from heparin drip infusion. Psychiatry: Mood & affect appropriate.     Data Reviewed: I have personally reviewed following labs and imaging studies  CBC: Recent Labs  Lab 09/08/20 1249 09/09/20 0936 09/10/20 0449 09/11/20 0536  WBC 7.8 10.6* 9.7 8.0  NEUTROABS 6.6  --   --   --   HGB 13.6 13.4 12.4 12.0  HCT 43.9 41.9 40.4 39.3  MCV 105.0* 102.4* 104.1* 105.4*  PLT 217 195 205 142    Basic Metabolic Panel: Recent Labs  Lab 09/08/20 1241 09/08/20 1249 09/09/20 0936 09/10/20 0449 09/11/20 0536  NA  --  140 141 140 138  K  --  4.3 3.2* 3.2* 4.0  CL  --  101 100 96* 94*  CO2  --  29 31 35* 36*  GLUCOSE  --  117* 79 82 83  BUN  --  44* 35* 31* 24*  CREATININE  --  1.08* 0.92 0.78 0.59  CALCIUM  --  8.9 8.5* 8.5* 8.7*  MG 2.2  --   --   --   --     GFR: CrCl cannot be calculated (Unknown ideal weight.).  Liver Function Tests: Recent Labs  Lab 09/08/20 1249  AST 31  ALT 20  ALKPHOS 114  BILITOT 0.8  PROT 6.8  ALBUMIN 3.6    CBG: No results for input(s): GLUCAP in the last 168 hours.   Recent Results (from the past 240 hour(s))  Blood Culture (routine x 2)      Status: None (Preliminary result)   Collection Time: 09/08/20 12:48 PM   Specimen: BLOOD  Result Value Ref Range Status   Specimen Description BLOOD BLOOD LEFT WRIST  Final   Special Requests   Final    BOTTLES DRAWN AEROBIC AND ANAEROBIC Blood Culture adequate volume   Culture   Final    NO GROWTH 3 DAYS Performed at [facility], [address], [place], [place] [address]    Report Status PENDING  Incomplete  Urine culture     Status: None   Collection Time: 09/08/20  1:15 PM   Specimen: Urine, Catheterized  Result Value Ref Range Status   Specimen Description   Final    URINE, CATHETERIZED Performed at [facility], [address], [place], [place] [address]    Special Requests   Final    NONE Performed at [facility], [address] [place], [place] [address]    Culture   Final    NO GROWTH Performed at [facility], [address] [place], [place] [address]    Report Status 09/10/2020 FINAL  Final  Resp Panel by RT-PCR (Flu A&B, Covid) Nasopharyngeal Swab     Status: None   Collection Time: 09/08/20  1:15 PM   Specimen: Nasopharyngeal Swab; Nasopharyngeal(NP) swabs in vial transport medium  Result Value Ref Range Status   SARS Coronavirus 2 by RT PCR NEGATIVE NEGATIVE Final    Comment: (NOTE) SARS-CoV-2 target nucleic acids are NOT DETECTED.  The SARS-CoV-2 RNA is generally detectable in upper respiratory specimens during the acute phase of infection. The lowest concentration of SARS-CoV-2 viral copies this assay can detect is 138 copies/mL. A negative result does not preclude SARS-Cov-2 infection and should not be used as the sole basis for treatment or other patient management decisions. A negative result may occur with  improper specimen collection/handling, submission of specimen other than nasopharyngeal swab, presence of viral mutation(s) within the areas targeted by this assay, and inadequate number of viral copies(<138 copies/mL). A negative result  must be combined with clinical observations, patient history, and epidemiological information. The expected result is Negative.  Fact Sheet for Patients:  BloggerCourse.com  Fact Sheet for Healthcare Providers:  SeriousBroker.it  This test is no t yet approved or cleared by the [place] FDA and  has been authorized for detection and/or diagnosis of SARS-CoV-2 by FDA under an Emergency Use Authorization (EUA). This EUA will remain  in effect (meaning this test can be used) for the duration of the COVID-19 declaration under Section 564(b)(1) of the Act, 21 U.S.C.section 360bbb-3(b)(1), unless the authorization is terminated  or revoked sooner.       Influenza A by PCR NEGATIVE NEGATIVE Final   Influenza B by PCR NEGATIVE NEGATIVE  Final    Comment: (NOTE) The Xpert Xpress SARS-CoV-2/FLU/RSV plus assay is intended as an aid in the diagnosis of influenza from Nasopharyngeal swab specimens and should not be used as a sole basis for treatment. Nasal washings and aspirates are unacceptable for Xpert Xpress SARS-CoV-2/FLU/RSV testing.  Fact Sheet for Patients: EntrepreneurPulse.com.au  Fact Sheet for Healthcare Providers: IncredibleEmployment.be  This test is not yet approved or cleared by the [place] FDA and has been authorized for detection and/or diagnosis of SARS-CoV-2 by FDA under an Emergency Use Authorization (EUA). This EUA will remain in effect (meaning this test can be used) for the duration of the COVID-19 declaration under Section 564(b)(1) of the Act, 21 U.S.C. section 360bbb-3(b)(1), unless the authorization is terminated or revoked.  Performed at [facility], [address], [place], [place] [address]   Blood Culture (routine x 2)     Status: None (Preliminary result)   Collection Time: 09/08/20  1:16 PM   Specimen: BLOOD  Result Value Ref Range Status   Specimen  Description BLOOD LEFT ANTECUBITAL  Final   Special Requests   Final    BOTTLES DRAWN AEROBIC AND ANAEROBIC Blood Culture adequate volume   Culture   Final    NO GROWTH 3 DAYS Performed at [facility], [address], [place], [place] [address]    Report Status PENDING  Incomplete  MRSA PCR Screening     Status: None   Collection Time: 09/08/20  6:29 PM   Specimen: Nasal Mucosa; Nasopharyngeal  Result Value Ref Range Status   MRSA by PCR NEGATIVE NEGATIVE Final    Comment:        The GeneXpert MRSA Assay (FDA approved for NASAL specimens only), is one component of a comprehensive MRSA colonization surveillance program. It is not intended to diagnose MRSA infection nor to guide or monitor treatment for MRSA infections. Performed at [facility], [address] [place], [place] [address]      Radiology Studies: No results found.  Scheduled Meds: . aspirin EC  81 mg Oral Daily  . Chlorhexidine Gluconate Cloth  6 each Topical Daily  . enoxaparin (LOVENOX) injection  40 mg Subcutaneous Q24H  . furosemide  20 mg Intravenous Q12H  . levothyroxine  50 mcg Oral Q0600  . pantoprazole  40 mg Oral Daily  . simvastatin  20 mg Oral q1800  . sodium chloride flush  3 mL Intravenous Q12H   Continuous Infusions: . sodium chloride       LOS: 3 days    Time spent: 30 minutes    Barton Dubois, MD [facility]   To contact the attending provider between 7A-7P or the covering provider during after hours 7P-7A, please log into the web site [email or web address] and access using universal [place] password for that web site. If you do not have the password, please call the hospital operator.  09/11/2020, 5:43 PM

## 2020-09-11 NOTE — Plan of Care (Signed)
  Problem: Acute Rehab PT Goals(only PT should resolve) Goal: Pt Will Go Supine/Side To Sit Outcome: Progressing Flowsheets (Taken 09/11/2020 1439) Pt will go Supine/Side to Sit: with minimal assist Goal: Patient Will Perform Sitting Balance Outcome: Progressing Flowsheets (Taken 09/11/2020 1439) Patient will perform sitting balance: with minimal assist Goal: Patient Will Transfer Sit To/From Stand Outcome: Progressing Flowsheets (Taken 09/11/2020 1439) Patient will transfer sit to/from stand: with minimal assist Goal: Pt Will Ambulate Outcome: Progressing Flowsheets (Taken 09/11/2020 1439) Pt will Ambulate:  25 feet  with minimal assist  with rolling walker   2:40 PM, 09/11/20 Ocie Bob, MPT Physical Therapist with [facility] [phone] office [phone] mobile phone

## 2020-09-11 NOTE — Evaluation (Signed)
Physical Therapy Evaluation Patient Details Name: Jessica Key MRN: [id number] DOB: Dec 14, 1926 Today's Date: 09/11/2020   History of Present Illness  Lizvette Lightsey is a 86 y.o. female with medical history significant of hypertension; she has not been seen or follow lately with any medical provider or taking prescription medications.  Acute encephalopathy is preventing patient to participate with interview and information has been collected after discussing with patient's daughter, patient's son, review of EMS records and talking with ED provider.  Apparently patient was not feeling well and having some diarrhea since Monday; last line the family spoke with her was on 09/07/2020; at that moment she expressed feeling weak and not feeling well.  Patient's daughter visited her today and found her on the floor unresponsive.  No fever, no nausea, no vomiting, no chest pain, no hematuria, no melena, no hematochezia or any other complaints reported.    Clinical Impression  Patient demonstrates slow labored movement for sitting up at bedside with frequent rest breaks, SpO2 dropped from 94% to 95% on room air and put back on 4 LPM O2.  Patient very weak and limited to a few steps at bedside due to fall risk and SpO2 dropping on exertion.  Patient tolerated sitting up in chair after therapy - nursing staff notified.  Patient will benefit from continued physical therapy in hospital and recommended venue below to increase strength, balance, endurance for safe ADLs and gait.      Follow Up Recommendations SNF    Equipment Recommendations  None recommended by PT    Recommendations for Other Services       Precautions / Restrictions Precautions Precautions: Fall Restrictions Weight Bearing Restrictions: No      Mobility  Bed Mobility Overal bed mobility: Needs Assistance Bed Mobility: Supine to Sit     Supine to sit: Mod assist;Max assist     General bed mobility comments: slow labored movement     Transfers Overall transfer level: Needs assistance   Transfers: Sit to/from Stand;Stand Pivot Transfers   Stand pivot transfers: Mod assist       General transfer comment: has difficulty completing sit to stands due to BLE weakness  Ambulation/Gait Ambulation/Gait assistance: Mod assist;Max assist Gait Distance (Feet): 4 Feet Assistive device: Rolling walker (2 wheeled) Gait Pattern/deviations: Decreased step length - left;Decreased stance time - right;Decreased stride length Gait velocity: decreased   General Gait Details: very unsteady on feet with near loss of balance due to weakness/fatigue  Stairs            Wheelchair Mobility    Modified Rankin (Stroke Patients Only)       Balance Overall balance assessment: Needs assistance Sitting-balance support: Feet supported;No upper extremity supported Sitting balance-Leahy Scale: Fair Sitting balance - Comments: seated at EOB   Standing balance support: During functional activity;Bilateral upper extremity supported Standing balance-Leahy Scale: Poor Standing balance comment: using RW                             Pertinent Vitals/Pain Pain Assessment: No/denies pain    Home Living Family/patient expects to be discharged to:: Private residence Living Arrangements: Alone Available Help at Discharge: Family;Available PRN/intermittently Type of Home: House Home Access: Stairs to enter Entrance Stairs-Rails: Doctor, general practice of Steps: 5 Home Layout: One level Home Equipment: Walker - 2 wheels;Cane - single point      Prior Function Level of Independence: Independent  Comments: Tourist information centre manager, drives     Higher education careers adviser        Extremity/Trunk Assessment   Upper Extremity Assessment Upper Extremity Assessment: Generalized weakness    Lower Extremity Assessment Lower Extremity Assessment: Generalized weakness    Cervical / Trunk Assessment Cervical /  Trunk Assessment: Kyphotic  Communication   Communication: HOH  Cognition Arousal/Alertness: Awake/alert Behavior During Therapy: Anxious Overall Cognitive Status: Within Functional Limits for tasks assessed                                 General Comments: slow to respond to questions possibly due to very [facility]      General Comments      Exercises     Assessment/Plan    PT Assessment Patient needs continued PT services  PT Problem List Decreased strength;Decreased activity tolerance;Decreased balance;Decreased mobility;Decreased knowledge of use of DME;Cardiopulmonary status limiting activity       PT Treatment Interventions DME instruction;Balance training;Gait training;Stair training;Functional mobility training;Therapeutic activities;Therapeutic exercise;Patient/family education    PT Goals (Current goals can be found in the Care Plan section)  Acute Rehab PT Goals Patient Stated Goal: return home with family to assist PT Goal Formulation: With patient/family Time For Goal Achievement: 09/25/20 Potential to Achieve Goals: Good    Frequency Min 3X/week   Barriers to discharge        Co-evaluation               AM-PAC PT "6 Clicks" Mobility  Outcome Measure Help needed turning from your back to your side while in a flat bed without using bedrails?: A Lot Help needed moving from lying on your back to sitting on the side of a flat bed without using bedrails?: A Lot Help needed moving to and from a bed to a chair (including a wheelchair)?: A Lot Help needed standing up from a chair using your arms (e.g., wheelchair or bedside chair)?: A Lot Help needed to walk in hospital room?: A Lot Help needed climbing 3-5 steps with a railing? : Total 6 Click Score: 11    End of Session Equipment Utilized During Treatment: Oxygen Activity Tolerance: Patient tolerated treatment well;Patient limited by fatigue Patient left: in chair;with call bell/phone within  reach Nurse Communication: Mobility status PT Visit Diagnosis: Unsteadiness on feet (R26.81);Other abnormalities of gait and mobility (R26.89);Muscle weakness (generalized) (M62.81)    Time: 8416-6063 PT Time Calculation (min) (ACUTE ONLY): 31 min   Charges:   PT Evaluation $PT Eval Moderate Complexity: 1 Mod PT Treatments $Therapeutic Activity: 23-37 mins        2:37 PM, 09/11/20 Ocie Bob, MPT Physical Therapist with [facility] 336 [phone] office [phone] mobile phone

## 2020-09-11 NOTE — [place] FL2 (Signed)
[place] MEDICAID FL2 LEVEL OF CARE SCREENING TOOL     IDENTIFICATION  Patient Name: Jessica Key Birthdate: Nov 01, 1926 Sex: female Admission Date (Current Location): 09/08/2020  [facility] and [place] Number:  [employer] and Address:  [facility],  [address] [place] [address]      Provider Number: [id number]  Attending Physician Name and Address:  Vassie Loll, MD  Relative Name and Phone Number:  [name] (daughter) Ph: [phone]    Current Level of Care: Hospital Recommended Level of Care: Skilled Nursing Facility Prior Approval Number:    Date Approved/Denied:   PASRR Number: [id number]  Discharge Plan: SNF    Current Diagnoses: Patient Active Problem List   Diagnosis Date Noted  . Sepsis secondary to UTI (HCC) 09/08/2020  . Acute CHF (congestive heart failure) (HCC) 09/08/2020  . NSTEMI (non-ST elevated myocardial infarction) (HCC) 09/08/2020  . Acute respiratory failure with hypoxia and hypercapnia (HCC) 09/08/2020  . Acute metabolic encephalopathy 09/08/2020    Orientation RESPIRATION BLADDER Height & Weight     Self, Time, Situation, Place  O2 (4L/min) Incontinent, External catheter Weight: 134 lb 11.2 oz (61.1 kg) Height:     BEHAVIORAL SYMPTOMS/MOOD NEUROLOGICAL BOWEL NUTRITION STATUS      Incontinent Diet (Soft diet)  AMBULATORY STATUS COMMUNICATION OF NEEDS Skin   Extensive Assist Verbally Skin abrasions, Other (Comment) (Abrasion: right thigh; blister: right distal buttock)                       Personal Care Assistance Level of Assistance  Bathing, Feeding, Dressing Bathing Assistance: Limited assistance Feeding assistance: Independent Dressing Assistance: Limited assistance     Functional Limitations Info  Sight, Hearing, Speech Sight Info: Adequate Hearing Info: Adequate Speech Info: Adequate    SPECIAL CARE FACTORS FREQUENCY  PT (By licensed PT)     PT Frequency: 5x's/week               Contractures Contractures Info: Not present    Additional Factors Info  Code Status, Allergies Code Status Info: DNR Allergies Info: NKA           Current Medications (09/11/2020):  This is the current hospital active medication list Current Facility-Administered Medications  Medication Dose Route Frequency Provider Last Rate Last Admin  . 0.9 %  sodium chloride infusion  250 mL Intravenous PRN Vassie Loll, MD      . acetaminophen (TYLENOL) tablet 650 mg  650 mg Oral Q4H PRN Vassie Loll, MD      . aspirin EC tablet 81 mg  81 mg Oral Daily Vassie Loll, MD   81 mg at 09/11/20 1000  . cefTRIAXone (ROCEPHIN) 1 g in sodium chloride 0.9 % 100 mL IVPB  1 g Intravenous Q24H Vassie Loll, MD   Stopped at 09/10/20 1243  . Chlorhexidine Gluconate Cloth 2 % PADS 6 each  6 each Topical Daily Vassie Loll, MD   6 each at 09/10/20 1006  . enoxaparin (LOVENOX) injection 40 mg  40 mg Subcutaneous Q24H Vassie Loll, MD   40 mg at 09/11/20 1001  . furosemide (LASIX) injection 20 mg  20 mg Intravenous Q12H Vassie Loll, MD   20 mg at 09/11/20 0853  . levothyroxine (SYNTHROID) tablet 50 mcg  50 mcg Oral Q0600 Vassie Loll, MD   50 mcg at 09/11/20 0962  . ondansetron (ZOFRAN) injection 4 mg  4 mg Intravenous Q6H PRN Vassie Loll, MD      . pantoprazole (  PROTONIX) EC tablet 40 mg  40 mg Oral Daily Vassie Loll, MD   40 mg at 09/11/20 1000  . simvastatin (ZOCOR) tablet 20 mg  20 mg Oral q1800 Vassie Loll, MD   20 mg at 09/10/20 1712  . sodium chloride flush (NS) 0.9 % injection 3 mL  3 mL Intravenous Q12H Vassie Loll, MD   3 mL at 09/11/20 1006  . sodium chloride flush (NS) 0.9 % injection 3 mL  3 mL Intravenous PRN Vassie Loll, MD         Discharge Medications: Please see discharge summary for a list of discharge medications.  Relevant Imaging Results:  Relevant Lab Results:   Additional Information SSN: [id number]  Ewing Schlein, LCSW

## 2020-09-11 NOTE — TOC Progression Note (Signed)
Transition of Care [facility]) - Progression Note    Patient Details  Name: Jessica Key MRN: [id number] Date of Birth: 23-Mar-1927  Transition of [facility]) CM/SW Contact  Leitha Bleak, RN Phone Number: 09/11/2020, 1:50 PM  Clinical Narrative:   PT is recommending SNF. TOC spoke with Daugher - Olegario Messier and son - Molly Maduro. They agree she needs to go to SNF. They are requesting [facility] at [facility] and gave permission to send to 4 and 5 star facilities. They live in [facility] and are on their [place] to visit. FL2 completed and sent out for bed offers. TOC to follow.    Expected Discharge Plan: Skilled Nursing Facility Barriers to Discharge: Continued Medical Work up  Expected Discharge Plan and Services Expected Discharge Plan: Skilled Nursing Facility       Living arrangements for the past 2 months: Single Family Home

## 2020-09-12 DIAGNOSIS — I272 Pulmonary hypertension, unspecified: Secondary | ICD-10-CM

## 2020-09-12 DIAGNOSIS — N390 Urinary tract infection, site not specified: Secondary | ICD-10-CM | POA: Diagnosis not present

## 2020-09-12 DIAGNOSIS — G9341 Metabolic encephalopathy: Secondary | ICD-10-CM | POA: Diagnosis not present

## 2020-09-12 DIAGNOSIS — J9601 Acute respiratory failure with hypoxia: Secondary | ICD-10-CM | POA: Diagnosis not present

## 2020-09-12 DIAGNOSIS — R319 Hematuria, unspecified: Secondary | ICD-10-CM

## 2020-09-12 DIAGNOSIS — I50811 Acute right heart failure: Secondary | ICD-10-CM

## 2020-09-12 DIAGNOSIS — A419 Sepsis, unspecified organism: Secondary | ICD-10-CM | POA: Diagnosis not present

## 2020-09-12 LAB — CBC
HCT: 38 % (ref 36.0–46.0)
Hemoglobin: 12 g/dL (ref 12.0–15.0)
MCH: 33 pg (ref 26.0–34.0)
MCHC: 31.6 g/dL (ref 30.0–36.0)
MCV: 104.4 fL — ABNORMAL HIGH (ref 80.0–100.0)
Platelets: 191 10*3/uL (ref 150–400)
RBC: 3.64 MIL/uL — ABNORMAL LOW (ref 3.87–5.11)
RDW: 14.9 % (ref 11.5–15.5)
WBC: 7.4 10*3/uL (ref 4.0–10.5)
nRBC: 0 % (ref 0.0–0.2)

## 2020-09-12 LAB — BASIC METABOLIC PANEL
Anion gap: 9 (ref 5–15)
BUN: 19 mg/dL (ref 8–23)
CO2: 40 mmol/L — ABNORMAL HIGH (ref 22–32)
Calcium: 8.6 mg/dL — ABNORMAL LOW (ref 8.9–10.3)
Chloride: 90 mmol/L — ABNORMAL LOW (ref 98–111)
Creatinine, Ser: 0.55 mg/dL (ref 0.44–1.00)
GFR, Estimated: >60 mL/min (ref >60)
Glucose, Bld: 75 mg/dL (ref 70–99)
Potassium: 3.5 mmol/L (ref 3.5–5.1)
Sodium: 139 mmol/L (ref 135–145)

## 2020-09-12 MED ORDER — FUROSEMIDE 10 MG/ML IJ SOLN
20.0000 mg | Freq: Every day | INTRAMUSCULAR | Status: DC
  Administered 2020-09-13: 20 mg via INTRAVENOUS
  Filled 2020-09-12: qty 2

## 2020-09-12 MED ORDER — SILDENAFIL CITRATE 20 MG PO TABS
20.0000 mg | ORAL_TABLET | Freq: Every day | ORAL | Status: DC
  Administered 2020-09-12 – 2020-09-13 (×2): 20 mg via ORAL
  Filled 2020-09-12 (×2): qty 1

## 2020-09-12 NOTE — Progress Notes (Signed)
PROGRESS NOTE    Jessica Key  UUV:[id number] DOB: 09/05/1927 DOA: 09/08/2020 PCP: Pcp, No    Chief Complaint  Patient presents with  . Altered Mental Status    Brief Narrative:  Jessica Key is a 86 y.o. female with medical history significant of hypertension; she has not been seen or follow lately with any medical provider or taking prescription medications.  Acute encephalopathy is preventing patient to participate with interview and information has been collected after discussing with patient's daughter, patient's son, review of EMS records and talking with ED provider.  Apparently patient was not feeling well and having some diarrhea since Monday; last line the family spoke with her was on 09/07/2020; at that moment she expressed feeling weak and not feeling well.  Patient's daughter visited her today and found her on the floor unresponsive.  No fever, no nausea, no vomiting, no chest pain, no hematuria, no melena, no hematochezia or any other complaints reported.  CT head was negative for any acute intracranial normalities in the ED.  ED Course: Patient work-up demonstrating acute respiratory failure with hypoxia and hypercapnia; chest x-ray with cardiomegaly, vascular congestion and pulmonary edema.  Elevated BNP and elevated troponin.  Urinalysis suggesting infection.  On admission patient was hypothermic, tachycardic, tachypneic and with hypoxia.  She has met sepsis criteria and received bolus of fluid and antibiotics after blood cultures and urine culture taken.  After finding x-ray changes and elevated BNP Lasix initiated.  TRH has been contacted to place patient in the hospital for further evaluation and management.  Review of Systems: As per HPI otherwise all other systems reviewed and are negative.  Assessment & Plan: 1-Sepsis secondary to UTI ([place]) -No growth appreciated on urine cultures -Patient has now completed 3 days of IV antibiotics for empirical management management.   Denies dysuria, is afebrile with normal WBCs. -continue supportive care maintain adequate hydration.  2-Acute right side CHF (congestive heart failure) ([place]) -Echo: Demonstrating preserved ejection fraction and no wall motion abnormalities on the left side. -Patient with right-sided failure and right ventricle hypertrophy with elevated pulmonary hypertension -Continue diuresis, low-sodium diet and start low-dose sildenafil. -Continue to follow clinical response.  3-presumed NSTEMI (non-ST elevated myocardial infarction) ([place]) -Will continue aspirin and statins -No beta-blocker due to bradycardia -Based on echo results and discussion with cardiology service will discontinue heparin drip.  4-Acute respiratory failure with hypoxia and hypercapnia (HCC) -Significantly improved overall; has not required the use of BiPAP and at this moment good oxygen saturation on 3-4 L nasal cannula supplementation as maintain. -Continue to wean oxygen supplementation as tolerated -Continue diuresis.  5-Acute metabolic encephalopathy -in the setting of sepsis and hypercapnia -Echo results and case discussed over the phone with cardiology service (Dr. Sallyanne Kuster), NSTEMI less likely and with concern for pulmonary embolism due to right heart hypertrophy.   -Heparin has been discontinue -Continue treatment with IV antibiotics for UTI/sepsis -D-dimer was checked to rule out significant pulmonary embolism and -negative -Continue to use just DVT prophylaxis. -CT head was negative for acute intracranial normalities -TSH found to be abnormal; treatment as mentioned below. -Continue supportive care and constant reorientation. -Patient mentation continued to improve; oriented x2 and following commands at this moment.  6-GI prophylaxis -continue PPI  7-DNR/DNI -confirmed with patient -wishes will be respected  -Discussed with patient's son at baseline; CODE STATUS has been confirmed.  At this moment they would  like palliative care involved to further assist with MOST form and directing advanced care plans moving forward.  8-hypothyroidism -started on synthroid 50 mcg daily -repeat thyroid panel in 3 months.  9-HLD -LDL 124 -Continue statins.  10-physical deconditioning -PT has seen patient and recommending skilled nursing facility at discharge for rehabilitation and further care.   DVT prophylaxis: heparin drip Code Status: DNR/DNI Family Communication: No family at bedside. Disposition:   Status is: Inpatient  Dispo: The patient is from: home              Anticipated d/c is to: Skilled nursing facility              Anticipated d/c date is: to be determine              Patient currently is not medically stable for discharge; still mildly SOB, using 3-4 L [place] supplementation and with decreased breath sounds at her lung bases during examination.  Will continue IV diuretics and start low dose sildenafil. Has completed antibiotics therapy, will request palliative care consultation. Overall long term outcome is poor.     Consultants:   Cardiology service (curbside; Dr. Sallyanne Kuster).   Procedures:  See below for x-ray reports  2D echo: Preserved ejection fraction, no wall motion normalities appreciated on the left ventricle.  Patient with significantly dilated right ventricle and elevated pulmonary artery pressure.  Antimicrobials:  Rocephin   Subjective: No fever, no chest pain, no nausea or vomiting.  Still feeling short of breath.  Objective: Vitals:   09/12/20 0700 09/12/20 0735 09/12/20 0800 09/12/20 1309  BP: (!) 118/54  (!) 118/49 121/68  Pulse: 72 (!) 57 63 65  Resp: $Remo'17 14 14 16  'yXcAI$ Temp:  (!) 97 F (36.1 C)  98.2 F (36.8 C)  TempSrc:  Axillary  Oral  SpO2:  100% 100% 99%  Weight:        Intake/Output Summary (Last 24 hours) at 09/12/2020 1453 Last data filed at 09/12/2020 0800 Gross per 24 hour  Intake --  Output 350 ml  Net -350 ml   Filed Weights   09/08/20  1211 09/08/20 1837 09/12/20 0500  Weight: 63 kg 61.1 kg 56.5 kg    Examination: General exam: Alert, awake, oriented x 3; slightly hard of hearing but following commands and answering questions appropriately.  Expressed to me that she just want to die.  No chest pain, no nausea, no vomiting.  Still feeling short of breath with minimal activity. Respiratory system: No wheezing, no using accessory muscle.  Positive decreased breath sounds at the bases.   Cardiovascular system:RRR. No murmurs, rubs, gallops. Gastrointestinal system: Abdomen is nondistended, soft and nontender. No organomegaly or masses felt. Normal bowel sounds heard. Central nervous system: Alert and oriented. No focal neurological deficits. Extremities: No cyanosis or clubbing.  Left upper lobe eczema Skin: No petechiae; no lower extremity edema appreciated. Psychiatry: Mood & affect appropriate.    Data Reviewed: I have personally reviewed following labs and imaging studies  CBC: Recent Labs  Lab 09/08/20 1249 09/09/20 0936 09/10/20 0449 09/11/20 0536 09/12/20 0423  WBC 7.8 10.6* 9.7 8.0 7.4  NEUTROABS 6.6  --   --   --   --   HGB 13.6 13.4 12.4 12.0 12.0  HCT 43.9 41.9 40.4 39.3 38.0  MCV 105.0* 102.4* 104.1* 105.4* 104.4*  PLT 217 195 205 201 517    Basic Metabolic Panel: Recent Labs  Lab 09/08/20 1241 09/08/20 1249 09/09/20 0936 09/10/20 0449 09/11/20 0536 09/12/20 0423  NA  --  140 141 140 138 139  K  --  4.3  3.2* 3.2* 4.0 3.5  CL  --  101 100 96* 94* 90*  CO2  --  29 31 35* 36* 40*  GLUCOSE  --  117* 79 82 83 75  BUN  --  44* 35* 31* 24* 19  CREATININE  --  1.08* 0.92 0.78 0.59 0.55  CALCIUM  --  8.9 8.5* 8.5* 8.7* 8.6*  MG 2.2  --   --   --   --   --     GFR: CrCl cannot be calculated (Unknown ideal weight.).  Liver Function Tests: Recent Labs  Lab 09/08/20 1249  AST 31  ALT 20  ALKPHOS 114  BILITOT 0.8  PROT 6.8  ALBUMIN 3.6    CBG: No results for input(s): GLUCAP in the  last 168 hours.   Recent Results (from the past 240 hour(s))  Blood Culture (routine x 2)     Status: None (Preliminary result)   Collection Time: 09/08/20 12:48 PM   Specimen: BLOOD  Result Value Ref Range Status   Specimen Description BLOOD BLOOD LEFT WRIST  Final   Special Requests   Final    BOTTLES DRAWN AEROBIC AND ANAEROBIC Blood Culture adequate volume   Culture   Final    NO GROWTH 4 DAYS Performed at [facility], [address] [place], [place] [address]    Report Status PENDING  Incomplete  Urine culture     Status: None   Collection Time: 09/08/20  1:15 PM   Specimen: Urine, Catheterized  Result Value Ref Range Status   Specimen Description   Final    URINE, CATHETERIZED Performed at [facility], [address], [place], [place] [address]    Special Requests   Final    NONE Performed at [facility], [address], [place], [place] [address]    Culture   Final    NO GROWTH Performed at [facility], [place] [address], [place],  [address]    Report Status 09/10/2020 FINAL  Final  Resp Panel by RT-PCR (Flu A&B, Covid) Nasopharyngeal Swab     Status: None   Collection Time: 09/08/20  1:15 PM   Specimen: Nasopharyngeal Swab; Nasopharyngeal(NP) swabs in vial transport medium  Result Value Ref Range Status   SARS Coronavirus 2 by RT PCR NEGATIVE NEGATIVE Final    Comment: (NOTE) SARS-CoV-2 target nucleic acids are NOT DETECTED.  The SARS-CoV-2 RNA is generally detectable in upper respiratory specimens during the acute phase of infection. The lowest concentration of SARS-CoV-2 viral copies this assay can detect is 138 copies/mL. A negative result does not preclude SARS-Cov-2 infection and should not be used as the sole basis for treatment or other patient management decisions. A negative result may occur with  improper specimen collection/handling, submission of specimen other than nasopharyngeal swab, presence of viral mutation(s) within the areas  targeted by this assay, and inadequate number of viral copies(<138 copies/mL). A negative result must be combined with clinical observations, patient history, and epidemiological information. The expected result is Negative.  Fact Sheet for Patients:  EntrepreneurPulse.com.au  Fact Sheet for Healthcare Providers:  IncredibleEmployment.be  This test is no t yet approved or cleared by the [place] FDA and  has been authorized for detection and/or diagnosis of SARS-CoV-2 by FDA under an Emergency Use Authorization (EUA). This EUA will remain  in effect (meaning this test can be used) for the duration of the COVID-19 declaration under Section 564(b)(1) of the Act, 21 U.S.C.section 360bbb-3(b)(1), unless the authorization  is terminated  or revoked sooner.       Influenza A by PCR NEGATIVE NEGATIVE Final   Influenza B by PCR NEGATIVE NEGATIVE Final    Comment: (NOTE) The Xpert Xpress SARS-CoV-2/FLU/RSV plus assay is intended as an aid in the diagnosis of influenza from Nasopharyngeal swab specimens and should not be used as a sole basis for treatment. Nasal washings and aspirates are unacceptable for Xpert Xpress SARS-CoV-2/FLU/RSV testing.  Fact Sheet for Patients: EntrepreneurPulse.com.au  Fact Sheet for Healthcare Providers: IncredibleEmployment.be  This test is not yet approved or cleared by the [place] FDA and has been authorized for detection and/or diagnosis of SARS-CoV-2 by FDA under an Emergency Use Authorization (EUA). This EUA will remain in effect (meaning this test can be used) for the duration of the COVID-19 declaration under Section 564(b)(1) of the Act, 21 U.S.C. section 360bbb-3(b)(1), unless the authorization is terminated or revoked.  Performed at [facility], [address], [place], [place] [address]   Blood Culture (routine x 2)     Status: None (Preliminary result)    Collection Time: 09/08/20  1:16 PM   Specimen: BLOOD  Result Value Ref Range Status   Specimen Description BLOOD LEFT ANTECUBITAL  Final   Special Requests   Final    BOTTLES DRAWN AEROBIC AND ANAEROBIC Blood Culture adequate volume   Culture   Final    NO GROWTH 4 DAYS Performed at [facility], [address], [place], [place] [address]    Report Status PENDING  Incomplete  MRSA PCR Screening     Status: None   Collection Time: 09/08/20  6:29 PM   Specimen: Nasal Mucosa; Nasopharyngeal  Result Value Ref Range Status   MRSA by PCR NEGATIVE NEGATIVE Final    Comment:        The GeneXpert MRSA Assay (FDA approved for NASAL specimens only), is one component of a comprehensive MRSA colonization surveillance program. It is not intended to diagnose MRSA infection nor to guide or monitor treatment for MRSA infections. Performed at [facility], [address], [place], [place] [address]      Radiology Studies: No results found.  Scheduled Meds: . aspirin EC  81 mg Oral Daily  . Chlorhexidine Gluconate Cloth  6 each Topical Daily  . enoxaparin (LOVENOX) injection  40 mg Subcutaneous Q24H  . [START ON 09/13/2020] furosemide  20 mg Intravenous Daily  . levothyroxine  50 mcg Oral Q0600  . pantoprazole  40 mg Oral Daily  . sildenafil  20 mg Oral Daily  . simvastatin  20 mg Oral q1800  . sodium chloride flush  3 mL Intravenous Q12H   Continuous Infusions: . sodium chloride       LOS: 4 days    Time spent: 30 minutes    Barton Dubois, MD [facility]   To contact the attending provider between 7A-7P or the covering provider during after hours 7P-7A, please log into the web site [email or web address] and access using universal [place] password for that web site. If you do not have the password, please call the hospital operator.  09/12/2020, 2:53 PM

## 2020-09-13 DIAGNOSIS — A419 Sepsis, unspecified organism: Secondary | ICD-10-CM | POA: Diagnosis not present

## 2020-09-13 DIAGNOSIS — I214 Non-ST elevation (NSTEMI) myocardial infarction: Secondary | ICD-10-CM | POA: Diagnosis not present

## 2020-09-13 DIAGNOSIS — I509 Heart failure, unspecified: Secondary | ICD-10-CM | POA: Diagnosis not present

## 2020-09-13 DIAGNOSIS — Z66 Do not resuscitate: Secondary | ICD-10-CM

## 2020-09-13 DIAGNOSIS — N390 Urinary tract infection, site not specified: Secondary | ICD-10-CM | POA: Diagnosis not present

## 2020-09-13 DIAGNOSIS — Z7189 Other specified counseling: Secondary | ICD-10-CM

## 2020-09-13 DIAGNOSIS — Z515 Encounter for palliative care: Secondary | ICD-10-CM

## 2020-09-13 LAB — BASIC METABOLIC PANEL
Anion gap: 9 (ref 5–15)
BUN: 19 mg/dL (ref 8–23)
CO2: 43 mmol/L — ABNORMAL HIGH (ref 22–32)
Calcium: 9 mg/dL (ref 8.9–10.3)
Chloride: 87 mmol/L — ABNORMAL LOW (ref 98–111)
Creatinine, Ser: 0.58 mg/dL (ref 0.44–1.00)
GFR, Estimated: >60 mL/min (ref >60)
Glucose, Bld: 77 mg/dL (ref 70–99)
Potassium: 3.8 mmol/L (ref 3.5–5.1)
Sodium: 139 mmol/L (ref 135–145)

## 2020-09-13 LAB — CBC
HCT: 39.8 % (ref 36.0–46.0)
Hemoglobin: 12.3 g/dL (ref 12.0–15.0)
MCH: 32.4 pg (ref 26.0–34.0)
MCHC: 30.9 g/dL (ref 30.0–36.0)
MCV: 104.7 fL — ABNORMAL HIGH (ref 80.0–100.0)
Platelets: 195 10*3/uL (ref 150–400)
RBC: 3.8 MIL/uL — ABNORMAL LOW (ref 3.87–5.11)
RDW: 14.9 % (ref 11.5–15.5)
WBC: 7.2 10*3/uL (ref 4.0–10.5)
nRBC: 0 % (ref 0.0–0.2)

## 2020-09-13 MED ORDER — BIOTENE DRY MOUTH MT LIQD: 15.0000 mL | OROMUCOSAL | Status: DC | PRN

## 2020-09-13 MED ORDER — LORAZEPAM 2 MG/ML PO CONC: 1.0000 mg | ORAL | Status: DC | PRN

## 2020-09-13 MED ORDER — MORPHINE SULFATE (CONCENTRATE) 10 MG/0.5ML PO SOLN: 5.0000 mg | ORAL | Status: DC | PRN

## 2020-09-13 MED ORDER — ATROPINE SULFATE 1 % OP SOLN: 4.0000 [drp] | OPHTHALMIC | Status: DC | PRN

## 2020-09-13 MED ORDER — LORAZEPAM 2 MG/ML IJ SOLN: 1.0000 mg | INTRAMUSCULAR | Status: DC | PRN

## 2020-09-13 MED ORDER — LORAZEPAM 1 MG PO TABS: 1.0000 mg | ORAL_TABLET | ORAL | Status: DC | PRN

## 2020-09-13 NOTE — Progress Notes (Signed)
Physical Therapy Note  Patient Details  Name: Jessica Key MRN: [id number] Date of Birth: [birth year] Today's Date: 09/13/2020    Attempted PT session.  Upon entrance visitor held finger infront of mouth indicating pt is resting.  Will attempted later today if available.  Becky Sax, LPTA/CLT; CBIS [phone] Jessica Key 09/13/2020, 10:38 AM

## 2020-09-13 NOTE — Consult Note (Signed)
Consultation Note Date: 09/13/2020   Patient Name: Jessica Key  DOB: 02-26-1927  MRN: [id number]  Age / Sex: 86 y.o., female  PCP: Pcp, No Referring Physician: Roxan Hockey, MD  Reason for Consultation: Establishing goals of care  HPI/Patient Profile: 86 y.o. female with no medical history, has not seen a doctor in over 20 years, does not take any home medications, she is a lifetime smoker admitted on 09/08/2020 with urosepsis and findings of CHF and resulting NSTEMI from demand ischemia.  She was found down at home by her daughter.  Palliative medicine consulted for goals of care.  Clinical Assessment and Goals of Care: I reviewed patient's chart and met with the patient and her son, Mikki Santee, who is her healthcare power of attorney. Patient was awake alert oriented.  She appeared frail-however she was delightfully strong in spirit. She previously worked as a Sport and exercise psychologist.  She has 4 children and her spouse is deceased. Prior to admission she was living at home, however she shares it had become very difficult for her to ambulate in the home or complete her ADLs.  She is now bedbound.  She is eating only bites and sips. Rosette is a very expressive and when asked about her goals of care she clearly states that she wants to go home and be kept comfortable until she dies.  One of her sons is planning on moving in with her. She would not want to return to the hospital, she does not want to wear oxygen or take medications unless they are for providing her comfort. She shares that she has had a beautiful life and has raised beautiful children and does not feel that it is necessary for her to " hang around anymore". Hospice philosophy of care and services provided were discussed.  Primary Decision Maker PATIENT and her son- Herbie Baltimore who is HCPOA    SUMMARY OF RECOMMENDATIONS -Transition to comfort measures only -Discontinue  cardiac monitoring -Comfort medications as ordered -Medications and interventions that are life-prolonging and not intended for comfort are discontinued -Transition of care referral for hospice services at home  Code Status/Advance Care Planning:  DNR  Additional Recommendations (Limitations, Scope, Preferences):  Full Comfort Care  Prognosis:    < 6 weeks-patient states she would prefer days but she will tolerate living for weeks  Discharge Planning: Home with Hospice  Primary Diagnoses: Present on Admission: . Sepsis secondary to UTI ([place]) . NSTEMI (non-ST elevated myocardial infarction) ([place]) . Acute respiratory failure with hypoxia and hypercapnia ([facility]) . Acute metabolic encephalopathy   I have reviewed the medical record, interviewed the patient and family, and examined the patient. The following aspects are pertinent.  History reviewed. No pertinent past medical history. Social History   Socioeconomic History  . Marital status: Widowed    Spouse name: Not on file  . Number of children: Not on file  . Years of education: Not on file  . Highest education level: Not on file  Occupational History  . Not on file  Tobacco Use  .  Smoking status: Not on file  Substance and Sexual Activity  . Alcohol use: Not on file  . Drug use: Not on file  . Sexual activity: Not on file  Other Topics Concern  . Not on file  Social History Narrative  . Not on file   Social Determinants of Health   Financial Resource Strain:   . Difficulty of Paying Living Expenses: Not on file  Food Insecurity:   . Worried About Programme researcher, broadcasting/film/video in the Last Year: Not on file  . Ran Out of Food in the Last Year: Not on file  Transportation Needs:   . Lack of Transportation (Medical): Not on file  . Lack of Transportation (Non-Medical): Not on file  Physical Activity:   . Days of Exercise per Week: Not on file  . Minutes of Exercise per Session: Not on file  Stress:   . Feeling of  Stress : Not on file  Social Connections:   . Frequency of Communication with Friends and Family: Not on file  . Frequency of Social Gatherings with Friends and Family: Not on file  . Attends Religious Services: Not on file  . Active Member of Clubs or Organizations: Not on file  . Attends Banker Meetings: Not on file  . Marital Status: Not on file   Scheduled Meds: . Chlorhexidine Gluconate Cloth  6 each Topical Daily  . furosemide  20 mg Intravenous Daily  . levothyroxine  50 mcg Oral Q0600  . pantoprazole  40 mg Oral Daily  . sodium chloride flush  3 mL Intravenous Q12H   Continuous Infusions: . sodium chloride     PRN Meds:.sodium chloride, acetaminophen, antiseptic oral rinse, atropine, LORazepam **OR** LORazepam **OR** LORazepam, morphine CONCENTRATE **OR** morphine CONCENTRATE, ondansetron (ZOFRAN) IV, sodium chloride flush Medications Prior to Admission:  Prior to Admission medications   Not on File   No Known Allergies Review of Systems  Constitutional: Positive for activity change, appetite change and fatigue.  Respiratory: Positive for shortness of breath.   Psychiatric/Behavioral: Negative for sleep disturbance.    Physical Exam Vitals and nursing note reviewed.  Constitutional:      Comments: frail  Cardiovascular:     Rate and Rhythm: Normal rate.  Pulmonary:     Comments: Dyspneic when speaking Neurological:     Mental Status: She is oriented to person, place, and time.     Motor: Weakness present.     Vital Signs: BP 110/62 (BP Location: Right Arm)   Pulse 62   Temp 97.7 F (36.5 C) (Oral)   Resp 18   Wt 56.7 kg   SpO2 99%  Pain Scale: 0-10   Pain Score: 0-No pain   SpO2: SpO2: 99 % O2 Device:SpO2: 99 % O2 Flow Rate: .O2 Flow Rate (L/min): 4 L/min  IO: Intake/output summary:   Intake/Output Summary (Last 24 hours) at 09/13/2020 1416 Last data filed at 09/13/2020 1217 Gross per 24 hour  Intake 120 ml  Output 1200 ml  Net  -1080 ml    LBM: Last BM Date: 09/12/20 Baseline Weight: Weight: 63 kg Most recent weight: Weight: 56.7 kg     Palliative Assessment/Data: PPS: 20%     Thank you for this consult. Palliative medicine will continue to follow and assist as needed.   Time In: 1305 Time Out: 1424 Time Total: 79 mins Greater than 50%  of this time was spent counseling and coordinating care related to the above assessment and plan.  Signed  by: Mariana Kaufman, AGNP-C Palliative Medicine    Please contact Palliative Medicine Team phone at [phone] for questions and concerns.  For individual provider: See Shea Evans

## 2020-09-13 NOTE — Progress Notes (Signed)
PROGRESS NOTE    Aleiah Mohammed  BJS:[id number] DOB: 1927/01/15 DOA: 09/08/2020 PCP: Pcp, No    Chief Complaint  Patient presents with  . Altered Mental Status    Brief Narrative:  Selenia Mihok is a 87 y.o. female with medical history significant of hypertension; she has not been seen or follow lately with any medical provider or taking prescription medications.  Acute encephalopathy is preventing patient to participate with interview and information has been collected after discussing with patient's daughter, patient's son, review of EMS records and talking with ED provider.  Apparently patient was not feeling well and having some diarrhea since Monday; last line the family spoke with her was on 09/07/2020; at that moment she expressed feeling weak and not feeling well.  Patient's daughter visited her today and found her on the floor unresponsive.  No fever, no nausea, no vomiting, no chest pain, no hematuria, no melena, no hematochezia or any other complaints reported.  CT head was negative for any acute intracranial normalities in the ED.  ED Course: Patient work-up demonstrating acute respiratory failure with hypoxia and hypercapnia; chest x-ray with cardiomegaly, vascular congestion and pulmonary edema.  Elevated BNP and elevated troponin.  Urinalysis suggesting infection.  On admission patient was hypothermic, tachycardic, tachypneic and with hypoxia.  She has met sepsis criteria and received bolus of fluid and antibiotics after blood cultures and urine culture taken.  After finding x-ray changes and elevated BNP Lasix initiated.  TRH has been contacted to place patient in the hospital for further evaluation and management.  Review of Systems: As per HPI otherwise all other systems reviewed and are negative.  -Palliative care consult appreciated on 09/13/2020 patient requested discharge home with hospice services  Assessment & Plan: 1-Sepsis secondary to UTI [facility]) -No growth appreciated  on urine cultures -Patient has now completed 3 days of IV antibiotics for empirical management - -Palliative care consult appreciated on 09/13/2020 patient requested discharge home with hospice services  2-Acute right side CHF (congestive heart failure) ([place]) -Echo: Demonstrating preserved ejection fraction and no wall motion abnormalities on the left side. -Patient with right-sided failure and right ventricle hypertrophy with elevated pulmonary hypertension -Initially treated with diuretics and sildenafil- -Palliative care consult appreciated on 09/13/2020 patient requested discharge home with hospice services  3-presumed NSTEMI (non-ST elevated myocardial infarction) ([place]) -No beta-blocker due to bradycardia -Initially treated with IV heparin, aspirin and statins --Palliative care consult appreciated on 09/13/2020 patient requested discharge home with hospice services  4-Acute respiratory failure with hypoxia and hypercapnia ([place]) -Significantly improved overall; has not required the use of BiPAP and at this moment good oxygen saturation on 3-4 L nasal cannula supplementation as maintain. -Continue to wean oxygen supplementation as tolerated  5-Acute metabolic encephalopathy -in the setting of sepsis and hypercapnia -Echo results and case discussed over the phone with cardiology service (Dr. Sallyanne Kuster), NSTEMI less likely and with concern for pulmonary embolism due to right heart hypertrophy.   -Heparin has been discontinue -CT head was negative for acute intracranial normalities -TSH found to be abnormal; treatment as mentioned below. -Continue supportive care and constant reorientation.  6-social/ethics--- patient is DNR/DNI -Palliative care consult appreciated on 09/13/2020 patient requested discharge home with hospice services  -- Hypothyroidism, HLD--- comfort care only  --physical deconditioning -PT has seen patient and recommending skilled nursing facility at discharge for  rehabilitation and further care--patient declines SNF placement she wants to go home with hospice   DVT prophylaxis: Comfort care  code Status: DNR/DNI Family Communication:  Son Disposition:   Status is: Inpatient  Dispo: The patient is from: home              Anticipated d/c is to: Home with hospice              Anticipated d/c date is: 09/14/2020                Consultants:   Cardiology service (curbside; Dr. Sallyanne Kuster).   Procedures:  See below for x-ray reports  2D echo: Preserved ejection fraction, no wall motion normalities appreciated on the left ventricle.  Patient with significantly dilated right ventricle and elevated pulmonary artery pressure.  Antimicrobials:  Rocephin   Subjective: -Dyspnea persist  -Palliative care consult appreciated on 09/13/2020 patient requested discharge home with hospice services - slightly hard of hearing but following commands and answering questions appropriately.  Expressed to me that she just want to die.     Objective: Vitals:   09/12/20 2207 09/13/20 0448 09/13/20 0500 09/13/20 1538  BP: (!) 102/52 110/62  (!) 105/46  Pulse: 63 62  (!) 58  Resp:  18  15  Temp: 97.9 F (36.6 C) 97.7 F (36.5 C)  (!) 97.5 F (36.4 C)  TempSrc: Oral Oral  Oral  SpO2: 99% 99%  98%  Weight:   56.7 kg     Intake/Output Summary (Last 24 hours) at 09/13/2020 1603 Last data filed at 09/13/2020 1217 Gross per 24 hour  Intake 120 ml  Output 1200 ml  Net -1080 ml   Filed Weights   09/08/20 1837 09/12/20 0500 09/13/20 0500  Weight: 61.1 kg 56.5 kg 56.7 kg    Examination: General exam: Alert, awake, oriented x 3;  Respiratory system: Diminished in bases, no wheezing Cardiovascular system:RRR. No murmurs, rubs, gallops. Gastrointestinal system: Abdomen is nondistended, soft and nontender.  Normal bowel sounds heard. Central nervous system: Alert and oriented. No focal neurological deficits. Extremities: No cyanosis or clubbing.  Left upper  lobe eczema Skin: No petechiae; no lower extremity edema appreciated. Psychiatry: Mood & affect appropriate.    Data Reviewed: I have personally reviewed following labs and imaging studies  CBC: Recent Labs  Lab 09/08/20 1249 09/08/20 1249 09/09/20 0936 09/10/20 0449 09/11/20 0536 09/12/20 0423 09/13/20 0827  WBC 7.8   < > 10.6* 9.7 8.0 7.4 7.2  NEUTROABS 6.6  --   --   --   --   --   --   HGB 13.6   < > 13.4 12.4 12.0 12.0 12.3  HCT 43.9   < > 41.9 40.4 39.3 38.0 39.8  MCV 105.0*   < > 102.4* 104.1* 105.4* 104.4* 104.7*  PLT 217   < > 195 205 201 191 195   < > = values in this interval not displayed.    Basic Metabolic Panel: Recent Labs  Lab 09/08/20 1241 09/08/20 1249 09/09/20 0936 09/10/20 0449 09/11/20 0536 09/12/20 0423 09/13/20 0827  NA  --    < > 141 140 138 139 139  K  --    < > 3.2* 3.2* 4.0 3.5 3.8  CL  --    < > 100 96* 94* 90* 87*  CO2  --    < > 31 35* 36* 40* 43*  GLUCOSE  --    < > 79 82 83 75 77  BUN  --    < > 35* 31* 24* 19 19  CREATININE  --    < > 0.92 0.78 0.59 0.55  0.58  CALCIUM  --    < > 8.5* 8.5* 8.7* 8.6* 9.0  MG 2.2  --   --   --   --   --   --    < > = values in this interval not displayed.    GFR: CrCl cannot be calculated (Unknown ideal weight.).  Liver Function Tests: Recent Labs  Lab 09/08/20 1249  AST 31  ALT 20  ALKPHOS 114  BILITOT 0.8  PROT 6.8  ALBUMIN 3.6    CBG: No results for input(s): GLUCAP in the last 168 hours.   Recent Results (from the past 240 hour(s))  Blood Culture (routine x 2)     Status: None (Preliminary result)   Collection Time: 09/08/20 12:48 PM   Specimen: BLOOD  Result Value Ref Range Status   Specimen Description BLOOD BLOOD LEFT WRIST  Final   Special Requests   Final    BOTTLES DRAWN AEROBIC AND ANAEROBIC Blood Culture adequate volume   Culture   Final    NO GROWTH 4 DAYS Performed at [facility], [address] [place], [place] [address]    Report Status PENDING  Incomplete    Urine culture     Status: None   Collection Time: 09/08/20  1:15 PM   Specimen: Urine, Catheterized  Result Value Ref Range Status   Specimen Description   Final    URINE, CATHETERIZED Performed at [facility], [address], [place], [place] [address]    Special Requests   Final    NONE Performed at [facility], [address], [place], [place] [address]    Culture   Final    NO GROWTH Performed at [facility], [place] [address] [place], [place] [address]    Report Status 09/10/2020 FINAL  Final  Resp Panel by RT-PCR (Flu A&B, Covid) Nasopharyngeal Swab     Status: None   Collection Time: 09/08/20  1:15 PM   Specimen: Nasopharyngeal Swab; Nasopharyngeal(NP) swabs in vial transport medium  Result Value Ref Range Status   SARS Coronavirus 2 by RT PCR NEGATIVE NEGATIVE Final    Comment: (NOTE) SARS-CoV-2 target nucleic acids are NOT DETECTED.  The SARS-CoV-2 RNA is generally detectable in upper respiratory specimens during the acute phase of infection. The lowest concentration of SARS-CoV-2 viral copies this assay can detect is 138 copies/mL. A negative result does not preclude SARS-Cov-2 infection and should not be used as the sole basis for treatment or other patient management decisions. A negative result may occur with  improper specimen collection/handling, submission of specimen other than nasopharyngeal swab, presence of viral mutation(s) within the areas targeted by this assay, and inadequate number of viral copies(<138 copies/mL). A negative result must be combined with clinical observations, patient history, and epidemiological information. The expected result is Negative.  Fact Sheet for Patients:  EntrepreneurPulse.com.au  Fact Sheet for Healthcare Providers:  IncredibleEmployment.be  This test is no t yet approved or cleared by the [place] FDA and  has been authorized for detection and/or diagnosis of SARS-CoV-2  by FDA under an Emergency Use Authorization (EUA). This EUA will remain  in effect (meaning this test can be used) for the duration of the COVID-19 declaration under Section 564(b)(1) of the Act, 21 U.S.C.section 360bbb-3(b)(1), unless the authorization is terminated  or revoked sooner.       Influenza A by PCR NEGATIVE NEGATIVE Final   Influenza B by PCR NEGATIVE NEGATIVE Final    Comment: (NOTE) The  Xpert Xpress SARS-CoV-2/FLU/RSV plus assay is intended as an aid in the diagnosis of influenza from Nasopharyngeal swab specimens and should not be used as a sole basis for treatment. Nasal washings and aspirates are unacceptable for Xpert Xpress SARS-CoV-2/FLU/RSV testing.  Fact Sheet for Patients: EntrepreneurPulse.com.au  Fact Sheet for Healthcare Providers: IncredibleEmployment.be  This test is not yet approved or cleared by the [place] FDA and has been authorized for detection and/or diagnosis of SARS-CoV-2 by FDA under an Emergency Use Authorization (EUA). This EUA will remain in effect (meaning this test can be used) for the duration of the COVID-19 declaration under Section 564(b)(1) of the Act, 21 U.S.C. section 360bbb-3(b)(1), unless the authorization is terminated or revoked.  Performed at [facility], [address] [place], [place] [address]   Blood Culture (routine x 2)     Status: None (Preliminary result)   Collection Time: 09/08/20  1:16 PM   Specimen: BLOOD  Result Value Ref Range Status   Specimen Description BLOOD LEFT ANTECUBITAL  Final   Special Requests   Final    BOTTLES DRAWN AEROBIC AND ANAEROBIC Blood Culture adequate volume   Culture   Final    NO GROWTH 4 DAYS Performed at [facility], [address] [place], [place] [address]    Report Status PENDING  Incomplete  MRSA PCR Screening     Status: None   Collection Time: 09/08/20  6:29 PM   Specimen: Nasal Mucosa; Nasopharyngeal  Result Value Ref Range  Status   MRSA by PCR NEGATIVE NEGATIVE Final    Comment:        The GeneXpert MRSA Assay (FDA approved for NASAL specimens only), is one component of a comprehensive MRSA colonization surveillance program. It is not intended to diagnose MRSA infection nor to guide or monitor treatment for MRSA infections. Performed at [facility], [address] [place], [place] [address]      Radiology Studies: No results found.  Scheduled Meds: . Chlorhexidine Gluconate Cloth  6 each Topical Daily  . pantoprazole  40 mg Oral Daily  . sodium chloride flush  3 mL Intravenous Q12H   Continuous Infusions: . sodium chloride       LOS: 5 days   Roxan Hockey, MD [facility]   09/13/2020, 4:03 PM

## 2020-09-13 NOTE — TOC Progression Note (Addendum)
Transition of Care [facility]) - Progression Note    Patient Details  Name: Jessica Key MRN: [id number] Date of Birth: December 08, 1926  Transition of Care [facility]) CM/SW Contact  Elliot Gault, LCSW Phone Number: 09/13/2020, 2:29 PM  Clinical Narrative:     TOC following. Per Palliative Care APNP, pt is requesting hospice at home upon dc. Spoke with pt and her son on the phone to discuss CMS providers. Referred to [facility] at pt request. Cassandra at [facility] will speak with pt and family and then update TOC on any DME needs and timing for hospice admission at home.  Will follow.  1602: Received update from Cassandra at [facility] stating that pt is accepted for hospice care at home. She is working on arranging DME for pt. Anticipating everything will be in place for pt to dc home tomorrow. Updated MD and RN. Will follow.  Expected Discharge Plan: Skilled Nursing Facility Barriers to Discharge: Continued Medical Work up  Expected Discharge Plan and Services Expected Discharge Plan: Skilled Nursing Facility       Living arrangements for the past 2 months: Single Family Home                                       Social Determinants of Health (SDOH) Interventions    Readmission Risk Interventions No flowsheet data found.

## 2020-09-14 DIAGNOSIS — N390 Urinary tract infection, site not specified: Secondary | ICD-10-CM | POA: Diagnosis not present

## 2020-09-14 DIAGNOSIS — A419 Sepsis, unspecified organism: Secondary | ICD-10-CM | POA: Diagnosis not present

## 2020-09-14 LAB — CULTURE, BLOOD (ROUTINE X 2)
Culture: NO GROWTH
Culture: NO GROWTH
Special Requests: ADEQUATE
Special Requests: ADEQUATE

## 2020-09-14 MED ORDER — ACETAMINOPHEN 325 MG PO TABS: 650.0000 mg | ORAL_TABLET | ORAL | 1 refills | Status: AC | PRN

## 2020-09-14 MED ORDER — ATROPINE SULFATE 1 % OP SOLN: 4.0000 [drp] | OPHTHALMIC | 12 refills | Status: AC | PRN

## 2020-09-14 MED ORDER — PANTOPRAZOLE SODIUM 40 MG PO TBEC: 40.0000 mg | DELAYED_RELEASE_TABLET | Freq: Every day | ORAL | 0 refills | Status: AC

## 2020-09-14 MED ORDER — MORPHINE SULFATE (CONCENTRATE) 10 MG/0.5ML PO SOLN: 5.0000 mg | ORAL | 0 refills | Status: AC | PRN

## 2020-09-14 MED ORDER — LORAZEPAM 1 MG PO TABS
0.5000 mg | ORAL_TABLET | ORAL | 0 refills | Status: AC | PRN
Start: 2020-09-14 — End: ?

## 2020-09-14 MED ORDER — BIOTENE DRY MOUTH MT LIQD: 15.0000 mL | OROMUCOSAL | 0 refills | Status: AC | PRN

## 2020-09-14 NOTE — Discharge Summary (Signed)
Jessica Key, is a 86 y.o. female  DOB 14-Nov-1926  MRN [id number].  Admission date:  09/08/2020  Admitting Physician  Barton Dubois, MD  Discharge Date:  09/14/2020   Primary MD  Pcp, No  Recommendations for primary care physician for things to follow:   Discharge home with full comfort care and hospice protocol- - Hospice patient  Admission Diagnosis  Sepsis secondary to UTI ([place]) [A41.9, N39.0]   Discharge Diagnosis  Sepsis secondary to UTI ([place]) [A41.9, N39.0]    Principal Problem:   Sepsis secondary to UTI [facility]) Active Problems:   Acute CHF (congestive heart failure) ([place])   NSTEMI (non-ST elevated myocardial infarction) ([place])   Acute respiratory failure with hypoxia and hypercapnia ([place])   Acute metabolic encephalopathy      History reviewed. No pertinent past medical history.  History reviewed. No pertinent surgical history.    HPI  from the history and physical done on the day of admission:    Patient coming from: Home (living by herself alone)  I have personally briefly reviewed patient's old medical records in [place]  Chief Complaint: Shortness of breath, unresponsiveness  HPI: Jessica Key is a 86 y.o. female with medical history significant of hypertension; she has not been seen or follow lately with any medical provider or taking prescription medications.  Acute encephalopathy is preventing patient to participate with interview and information has been collected after discussing with patient's daughter, patient's son, review of EMS records and talking with ED provider.  Apparently patient was not feeling well and having some diarrhea since Monday; last line the family spoke with her was on 09/07/2020; at that moment she expressed feeling weak and not feeling well.  Patient's daughter visited her today and found her on the floor unresponsive.  No fever, no nausea, no  vomiting, no chest pain, no hematuria, no melena, no hematochezia or any other complaints reported.  CT head was negative for any acute intracranial normalities in the ED.  ED Course: Patient work-up demonstrating acute respiratory failure with hypoxia and hypercapnia; chest x-ray with cardiomegaly, vascular congestion and pulmonary edema.  Elevated BNP and elevated troponin.  Urinalysis suggesting infection.  On admission patient was hypothermic, tachycardic, tachypneic and with hypoxia.  She has met sepsis criteria and received bolus of fluid and antibiotics after blood cultures and urine culture taken.  After finding x-ray changes and elevated BNP Lasix initiated.  TRH has been contacted to place patient in the hospital for further evaluation and management.  Review of Systems: As per HPI otherwise all other systems reviewed and are negative.    Hospital Course:     Brief Narrative:  Jessica Key a 86 y.o.femalewith medical history significant ofhypertension;she has not been seen or follow lately with any medical provider or taking prescription medications. Acute encephalopathy is preventing patient to participate with interview and information has been collected after discussing with patient's daughter, patient's son, review of EMS records and talking with ED provider. Apparently patient was not feeling well and having  some diarrhea since Monday; last line the family spoke with her was on 09/07/2020;at that moment she expressed feeling weak and not feeling well. Patient's daughter visited her today and found her on the floor unresponsive. No fever, no nausea, no vomiting, no chest pain, no hematuria, no melena, no hematochezia or any other complaints reported.  CT head was negative for any acute intracranial normalities in the ED.  ED Course:Patient work-up demonstrating acute respiratory failure with hypoxia and hypercapnia; chest x-ray with cardiomegaly, vascular congestion and  pulmonary edema. Elevated BNPand elevated troponin. Urinalysis suggesting infection. On admission patient was hypothermic, tachycardic, tachypneic and with hypoxia. She has met sepsis criteria and received bolus of fluid and antibiotics after blood cultures and urine culture taken. After finding x-ray changes and elevated BNP Lasix initiated. TRH has been contacted to place patient in the hospital for further evaluation and management.  Review of Systems: As per HPI otherwise all other systems reviewed and are negative.  -Palliative care consult appreciated on 09/13/2020 patient requested discharge home with hospice services  Assessment & Plan: 1-Sepsis secondary to UTI [facility]) -No growth appreciated on urine cultures -Patient has now completed 3 days of IV antibiotics for empirical management - -Palliative care consult appreciated on 09/13/2020 patient requested discharge home with hospice services -- Discharge home with full comfort care and hospice protocol- - Hospice patient  2-Acute right side CHF (congestive heart failure) ([place]) -Echo: Demonstrating preserved ejection fraction and no wall motion abnormalities on the left side. -Patient with right-sided failure and right ventricle hypertrophy with elevated pulmonary hypertension -Initially treated with diuretics and sildenafil- -Palliative care consult appreciated on 09/13/2020 patient requested discharge home with hospice services -- Discharge home with full comfort care and hospice protocol- - Hospice patient  3-Presumed NSTEMI (non-ST elevated myocardial infarction) ([place]) -No beta-blocker due to bradycardia -Initially treated with IV heparin, aspirin and statins --Palliative care consult appreciated on 09/13/2020 patient requested discharge home with hospice services - Discharge home with full comfort care and hospice protocol- - Hospice patient  4-Acute respiratory failure with hypoxia and hypercapnia  ([place]) -Significantly improved overall; has not required the use of BiPAP and at this moment good oxygen saturation on 3-4 L nasal cannula supplementation as maintain. -Continue to wean oxygen supplementation as tolerated  5-Acute metabolic encephalopathy -in the setting of sepsis and hypercapnia -Echo results and case discussed over the phone with cardiology service (Dr. Sallyanne Kuster), NSTEMI less likely and with concern for pulmonary embolism due to right heart hypertrophy.   -Heparin has been discontinue -CT head was negative for acute intracranial normalities -TSH found to be abnormal; treatment as mentioned below. -Continue supportive care and constant reorientation.  6-social/ethics--- patient is DNR/DNI -Palliative care consult appreciated on 09/13/2020 patient requested discharge home with hospice services Discharge home with full comfort care and hospice protocol- - Hospice patient -- Hypothyroidism, HLD--- comfort care only  --physical deconditioning -PT has seen patient and recommending skilled nursing facility at discharge for rehabilitation and further care--patient declines SNF placement she wants to go home with hospice  code Status: DNR/DNI Family Communication:  Son Disposition: Discharge home with full comfort care and hospice protocol- - Hospice patient     Consultants:   Cardiology service (curbside; Dr. Sallyanne Kuster).  Palliative care   Procedures:  See below for x-ray reports  2D echo: Preserved ejection fraction, no wall motion normalities appreciated on the left ventricle.  Patient with significantly dilated right ventricle and elevated pulmonary artery pressure.  Antimicrobials:  Rocephin  Discharge Condition: Discharge home with full comfort care and hospice protocol- - Hospice patient  Follow UP--hospitalist  Diet and Activity recommendation:  As advised  Discharge Instructions    Discharge Instructions    Call MD for:   difficulty breathing, headache or visual disturbances   Complete by: As directed    Call MD for:  persistant dizziness or light-headedness   Complete by: As directed    Call MD for:  persistant nausea and vomiting   Complete by: As directed    Call MD for:  severe uncontrolled pain   Complete by: As directed    Call MD for:  temperature >100.4   Complete by: As directed    Diet general   Complete by: As directed    Discharge instructions   Complete by: As directed    Discharge home with full comfort care and hospice protocol- - Hospice patient   Increase activity slowly   Complete by: As directed       Discharge Medications     Allergies as of 09/14/2020   No Known Allergies     Medication List    TAKE these medications   acetaminophen 325 MG tablet Commonly known as: TYLENOL Take 2 tablets (650 mg total) by mouth every 4 (four) hours as needed for headache or mild pain.   antiseptic oral rinse Liqd Apply 15 mLs topically as needed for dry mouth.   atropine 1 % ophthalmic solution Place 4 drops under the tongue every 4 (four) hours as needed (excessive secretions).   LORazepam 1 MG tablet Commonly known as: ATIVAN Take 0.5-1 tablets (0.5-1 mg total) by mouth every 4 (four) hours as needed for anxiety or sleep (-- Hospice patient).   morphine CONCENTRATE 10 MG/0.5ML Soln concentrated solution Take 0.25 mLs (5 mg total) by mouth every 2 (two) hours as needed for moderate pain (or dyspnea--- Hospice patient). -- Hospice patient   pantoprazole 40 MG tablet Commonly known as: PROTONIX Take 1 tablet (40 mg total) by mouth daily. Start taking on: September 15, 2020       Major procedures and Radiology Reports - PLEASE review detailed and final reports for all details, in brief -    CT Head Wo Contrast  Result Date: 09/08/2020 CLINICAL DATA:  86 year old female found down. EXAM: CT HEAD WITHOUT CONTRAST TECHNIQUE: Contiguous axial images were obtained from the base  of the skull through the vertex without intravenous contrast. COMPARISON:  None. FINDINGS: Brain: Fairly symmetric and circumscribed bilateral hyperdense choroid plexus cysts (series 2, image 16). No intraventricular hemorrhage suspected. A no ventriculomegaly. No midline shift, mass effect, evidence of mass lesion, intracranial hemorrhage or evidence of cortically based acute infarction. Patchy bilateral white matter hypodensity. No cortical encephalomalacia identified. Vascular: Mild Calcified atherosclerosis at the skull base. No suspicious intracranial vascular hyperdensity. Skull: No fracture identified. Partially visible upper cervical spine degeneration. Sinuses/Orbits: Visualized paranasal sinuses and mastoids are well pneumatized. Other: No discrete orbit or scalp soft tissue injury identified. IMPRESSION: No acute intracranial abnormality or acute traumatic injury identified. Electronically Signed   By: Genevie Ann M.D.   On: 09/08/2020 14:06   DG Pelvis Portable  Result Date: 09/08/2020 CLINICAL DATA:  Status post fall. EXAM: PORTABLE PELVIS 1-2 VIEWS COMPARISON:  None. FINDINGS: Diminished exam detail due to patient positioning. The bones appear diffusely osteopenic. No fracture or dislocation identified. Degenerative disc disease noted within the imaged portions of the lumbar spine. IMPRESSION: 1. Suboptimal exam due to patient positioning. No acute  findings. If there is a continued concern for fracture recommend repeat imaging as patient's clinical condition tolerates. 2. Osteopenia. Electronically Signed   By: Signa Kell M.D.   On: 09/08/2020 13:22   DG Chest Port 1 View  Result Date: 09/08/2020 CLINICAL DATA:  Questionable sepsis. Evaluate for pulmonary abnormality. EXAM: PORTABLE CHEST 1 VIEW COMPARISON:  None. FINDINGS: Cardiac enlargement and aortic atherosclerosis. Pulmonary vascular congestion. No frank edema, pleural effusion or airspace consolidation. IMPRESSION: Cardiac enlargement  and pulmonary vascular congestion. Aortic Atherosclerosis (ICD10-I70.0). Electronically Signed   By: Signa Kell M.D.   On: 09/08/2020 13:19   ECHOCARDIOGRAM COMPLETE  Result Date: 09/09/2020    ECHOCARDIOGRAM REPORT   Patient Name:   Jessica Key Date of Exam: 09/09/2020 Medical Rec #:  [id number]  Height: Accession #:    [id number] Weight:       134.7 lb Date of Birth:  1927-05-09  BSA:          1.552 m Patient Age:    89 years   BP:           145/93 mmHg Patient Gender: F          HR:           47 bpm. [facility] Procedure: 2D Echo, Cardiac Doppler and Color Doppler Indications:    NSTEMI I21.4  History:        Patient has no prior history of Echocardiogram examinations.                 CHF; NSTEMI I21.4. Acute respiratory failure with hypoxia and                 hypercapnia.  Sonographer:    Celesta Gentile RCS Referring Phys: [phone] CARLOS MADERA IMPRESSIONS  1. Left ventricular ejection fraction, by estimation, is 65 to 70%. The left ventricle has normal function. The left ventricle has no regional wall motion abnormalities. Left ventricular diastolic parameters are consistent with Grade I diastolic dysfunction (impaired relaxation). There is the interventricular septum is flattened in systole and diastole, consistent with right ventricular pressure and volume overload.  2. Right ventricular systolic function is severely reduced. The right ventricular size is severely enlarged. There is severely elevated pulmonary artery systolic pressure.  3. Left atrial size was mildly dilated.  4. Right atrial size was mildly dilated.  5. A small pericardial effusion is present. The pericardial effusion is circumferential.  6. The mitral valve is normal in structure. Trivial mitral valve regurgitation.  7. Tricuspid valve regurgitation is moderate to severe.  8. The aortic valve is tricuspid. Aortic valve regurgitation is mild to moderate. No aortic stenosis is present.  9. Aortic dilatation noted. There is  mild dilatation of the aortic root, measuring 39 mm. 10. The inferior vena cava is dilated in size with <50% respiratory variability, suggesting right atrial pressure of 15 mmHg. Comparison(s): Findings are consistent with acute on chronic cor pulmonale. Consider pulmonary embolism superimposed on chronic pulmonary HTN due to COPD or OSA. FINDINGS  Left Ventricle: Left ventricular ejection fraction, by estimation, is 65 to 70%. The left ventricle has normal function. The left ventricle has no regional wall motion abnormalities. The left ventricular internal cavity size was normal in size. There is  no left ventricular hypertrophy. The interventricular septum is flattened in systole and diastole, consistent with right ventricular pressure and volume overload. Left ventricular diastolic parameters are consistent with Grade I diastolic dysfunction (impaired relaxation). Indeterminate filling pressures. Right  Ventricle: The right ventricular size is severely enlarged. No increase in right ventricular wall thickness. Right ventricular systolic function is severely reduced. There is severely elevated pulmonary artery systolic pressure. The tricuspid regurgitant velocity is 3.88 m/s, and with an assumed right atrial pressure of 15 mmHg, the estimated right ventricular systolic pressure is 81.8 mmHg. Left Atrium: Left atrial size was mildly dilated. Right Atrium: Right atrial size was mildly dilated. Pericardium: A small pericardial effusion is present. The pericardial effusion is circumferential. Mitral Valve: The mitral valve is normal in structure. Trivial mitral valve regurgitation. Tricuspid Valve: The tricuspid valve is normal in structure. Tricuspid valve regurgitation is moderate to severe. Aortic Valve: The aortic valve is tricuspid. Aortic valve regurgitation is mild to moderate. No aortic stenosis is present. Pulmonic Valve: The pulmonic valve was grossly normal. Pulmonic valve regurgitation is mild. Aorta:  Aortic dilatation noted. There is mild dilatation of the aortic root, measuring 39 mm. Venous: The inferior vena cava is dilated in size with less than 50% respiratory variability, suggesting right atrial pressure of 15 mmHg. IAS/Shunts: No atrial level shunt detected by color flow Doppler.  LEFT VENTRICLE PLAX 2D LVIDd:         4.80 cm  Diastology LVIDs:         2.50 cm  LV e' medial:    4.13 cm/s LV PW:         1.00 cm  LV E/e' medial:  16.2 LV IVS:        1.10 cm  LV e' lateral:   7.18 cm/s LVOT diam:     1.80 cm  LV E/e' lateral: 9.3 LV SV:         68 LV SV Index:   44 LVOT Area:     2.54 cm  RIGHT VENTRICLE RV S prime:     13.60 cm/s TAPSE (M-mode): 2.1 cm LEFT ATRIUM           Index       RIGHT ATRIUM           Index LA diam:      4.00 cm 2.58 cm/m  RA Area:     20.50 cm LA Vol (A4C): 41.5 ml 26.75 ml/m RA Volume:   59.70 ml  38.48 ml/m  AORTIC VALVE LVOT Vmax:   134.50 cm/s LVOT Vmean:  82.050 cm/s LVOT VTI:    0.268 m  AORTA Ao Root diam: 3.90 cm MITRAL VALVE                TRICUSPID VALVE MV Area (PHT): 2.78 cm     TR Peak grad:   60.2 mmHg MV Decel Time: 273 msec     TR Vmax:        388.00 cm/s MV E velocity: 66.75 cm/s MV A velocity: 116.00 cm/s  SHUNTS MV E/A ratio:  0.58         Systemic VTI:  0.27 m                             Systemic Diam: 1.80 cm Dani Gobble Croitoru MD Electronically signed by Sanda Klein MD Signature Date/Time: 09/09/2020/1:49:30 PM    Final     Micro Results    Recent Results (from the past 240 hour(s))  Blood Culture (routine x 2)     Status: None   Collection Time: 09/08/20 12:48 PM   Specimen: BLOOD  Result Value Ref Range Status   Specimen  Description BLOOD BLOOD LEFT WRIST  Final   Special Requests   Final    BOTTLES DRAWN AEROBIC AND ANAEROBIC Blood Culture adequate volume   Culture   Final    NO GROWTH 6 DAYS Performed at [facility], [address], [place], [place] [address]    Report Status 09/14/2020 FINAL  Final  Urine culture     Status: None    Collection Time: 09/08/20  1:15 PM   Specimen: Urine, Catheterized  Result Value Ref Range Status   Specimen Description   Final    URINE, CATHETERIZED Performed at [facility], [address], [place], [place] [address]    Special Requests   Final    NONE Performed at [facility], [address] [place], [place] [address]    Culture   Final    NO GROWTH Performed at [facility], [address] [place], [place] [address]    Report Status 09/10/2020 FINAL  Final  Resp Panel by RT-PCR (Flu A&B, Covid) Nasopharyngeal Swab     Status: None   Collection Time: 09/08/20  1:15 PM   Specimen: Nasopharyngeal Swab; Nasopharyngeal(NP) swabs in vial transport medium  Result Value Ref Range Status   SARS Coronavirus 2 by RT PCR NEGATIVE NEGATIVE Final    Comment: (NOTE) SARS-CoV-2 target nucleic acids are NOT DETECTED.  The SARS-CoV-2 RNA is generally detectable in upper respiratory specimens during the acute phase of infection. The lowest concentration of SARS-CoV-2 viral copies this assay can detect is 138 copies/mL. A negative result does not preclude SARS-Cov-2 infection and should not be used as the sole basis for treatment or other patient management decisions. A negative result may occur with  improper specimen collection/handling, submission of specimen other than nasopharyngeal swab, presence of viral mutation(s) within the areas targeted by this assay, and inadequate number of viral copies(<138 copies/mL). A negative result must be combined with clinical observations, patient history, and epidemiological information. The expected result is Negative.  Fact Sheet for Patients:  EntrepreneurPulse.com.au  Fact Sheet for Healthcare Providers:  IncredibleEmployment.be  This Key is no t yet approved or cleared by the [place] FDA and  has been authorized for detection and/or diagnosis of SARS-CoV-2 by FDA under an Emergency Use  Authorization (EUA). This EUA will remain  in effect (meaning this Key can be used) for the duration of the COVID-19 declaration under Section 564(b)(1) of the Act, 21 U.S.C.section 360bbb-3(b)(1), unless the authorization is terminated  or revoked sooner.       Influenza A by PCR NEGATIVE NEGATIVE Final   Influenza B by PCR NEGATIVE NEGATIVE Final    Comment: (NOTE) The Xpert Xpress SARS-CoV-2/FLU/RSV plus assay is intended as an aid in the diagnosis of influenza from Nasopharyngeal swab specimens and should not be used as a sole basis for treatment. Nasal washings and aspirates are unacceptable for Xpert Xpress SARS-CoV-2/FLU/RSV testing.  Fact Sheet for Patients: EntrepreneurPulse.com.au  Fact Sheet for Healthcare Providers: IncredibleEmployment.be  This Key is not yet approved or cleared by the [place] FDA and has been authorized for detection and/or diagnosis of SARS-CoV-2 by FDA under an Emergency Use Authorization (EUA). This EUA will remain in effect (meaning this Key can be used) for the duration of the COVID-19 declaration under Section 564(b)(1) of the Act, 21 U.S.C. section 360bbb-3(b)(1), unless the authorization is terminated or revoked.  Performed at [facility], [address], [place], [place] [address]   Blood Culture (routine x 2)  Status: None   Collection Time: 09/08/20  1:16 PM   Specimen: BLOOD  Result Value Ref Range Status   Specimen Description BLOOD LEFT ANTECUBITAL  Final   Special Requests   Final    BOTTLES DRAWN AEROBIC AND ANAEROBIC Blood Culture adequate volume   Culture   Final    NO GROWTH 6 DAYS Performed at [facility], [address], [place], [place] [address]    Report Status 09/14/2020 FINAL  Final  MRSA PCR Screening     Status: None   Collection Time: 09/08/20  6:29 PM   Specimen: Nasal Mucosa; Nasopharyngeal  Result Value Ref Range Status   MRSA by PCR NEGATIVE NEGATIVE Final     Comment:        The GeneXpert MRSA Assay (FDA approved for NASAL specimens only), is one component of a comprehensive MRSA colonization surveillance program. It is not intended to diagnose MRSA infection nor to guide or monitor treatment for MRSA infections. Performed at [facility], [address] [place], [place] [address]    Today   Subjective    Jessica Key today has no new complaints       -Discharge home with full comfort care and hospice protocol- - Hospice patient     Patient has been seen and examined prior to discharge   Objective   Blood pressure 133/60, pulse 66, temperature 97.6 F (36.4 C), resp. rate 20, weight 56.7 kg, SpO2 99 %.   Intake/Output Summary (Last 24 hours) at 09/14/2020 1356 Last data filed at 09/14/2020 0800 Gross per 24 hour  Intake 240 ml  Output 400 ml  Net -160 ml   Exam Gen:- Awake Alert, no acute distress  HEENT:- [place].AT, No sclera icterus Nose- Veteran 3 L/min Neck-Supple Neck,No JVD,.  Lungs-diminished breath sounds, no wheeze CV- S1, S2 normal, regular Abd-  +ve B.Sounds, Abd Soft, No tenderness,    Extremity/Skin:-  good pulses Psych-affect is appropriate, oriented x3 Neuro-generalized weakness , no new focal deficits, no tremors    Data Review   CBC w Diff:  Lab Results  Component Value Date   WBC 7.2 09/13/2020   HGB 12.3 09/13/2020   HCT 39.8 09/13/2020   PLT 195 09/13/2020   LYMPHOPCT 6 09/08/2020   MONOPCT 7 09/08/2020   EOSPCT 0 09/08/2020   BASOPCT 0 09/08/2020    CMP:  Lab Results  Component Value Date   NA 139 09/13/2020   K 3.8 09/13/2020   CL 87 (L) 09/13/2020   CO2 43 (H) 09/13/2020   BUN 19 09/13/2020   CREATININE 0.58 09/13/2020   PROT 6.8 09/08/2020   ALBUMIN 3.6 09/08/2020   BILITOT 0.8 09/08/2020   ALKPHOS 114 09/08/2020   AST 31 09/08/2020   ALT 20 09/08/2020  .   Total Discharge time is about 33 minutes  Roxan Hockey M.D on 09/14/2020 at 1:56 PM  Go to [email or web address] -  for  contact info  [facility] - Office  [phone]

## 2020-09-14 NOTE — Discharge Instructions (Signed)
Discharge home with full comfort care and hospice protocol- - Hospice patient

## 2020-09-14 NOTE — TOC Transition Note (Signed)
Transition of Care [facility]) - CM/SW Discharge Note   Patient Details  Name: Jessica Key MRN: [id number] Date of Birth: 1927-10-12  [facility] CM/SW Contact:  Elliot Gault, LCSW Phone Number: 09/14/2020, 1:35 PM   Clinical Narrative:     Pt stable for dc home with hospice care today per MD. Sherron Monday with Cassandra at [facility] today and she states that the DME has been delivered and family ready for pt to come home. EMS called for transport. RN to notify son once EMS arrives so they can have someone at the home when pt arrives.   There are no other TOC needs for dc.  Final next level of care: Home w Hospice Care Barriers to Discharge: Barriers Resolved   Patient Goals and CMS Choice Patient states their goals for this hospitalization and ongoing recovery are:: to go to SNF. CMS Medicare.gov Compare Post Acute Care list provided to:: Patient Represenative (must comment) Choice offered to / list presented to : Adult Children  Discharge Placement                       Discharge Plan and Services                                     Social Determinants of Health (SDOH) Interventions     Readmission Risk Interventions Readmission Risk Prevention Plan 09/14/2020  Medication Screening Complete  Transportation Screening Complete

## 2020-09-14 NOTE — Care Management Important Message (Signed)
Important Message  Patient Details  Name: Jessica Key MRN: [id number] Date of Birth: [birth year]   Medicare Important Message Given:  Yes     Corey Harold 09/14/2020, 12:35 PM

## 2020-10-14 DEATH — deceased

## 2021-04-27 IMAGING — CT CT HEAD W/O CM
4 series · 16 of 47 positions shown, 18 images · non-contrast
Comparison: None.

CLINICAL DATA: [AGE] female found down.

EXAM:
CT HEAD WITHOUT CONTRAST
TECHNIQUE: Contiguous axial images were obtained from the base of the skull
through the vertex without intravenous contrast.

[Series 2: head w o · axial · 0.46mm/px · z∈[+14,+134]mm · 7 of 32 slices shown, 9 images]
[im 4/32  brain]
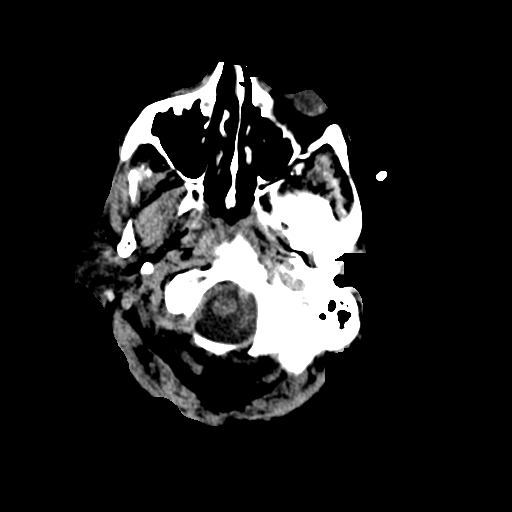
[im 4/32  bone]
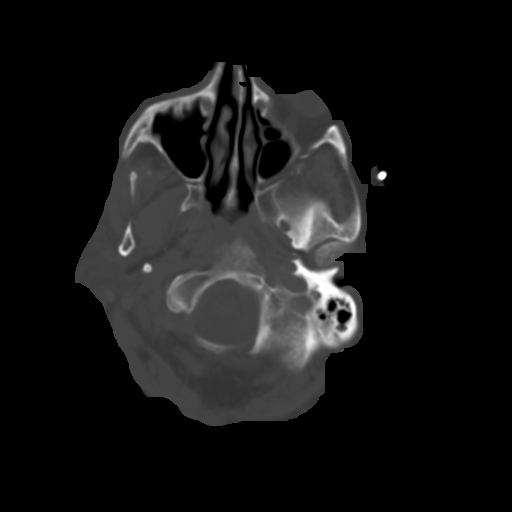
[im 8/32  brain]
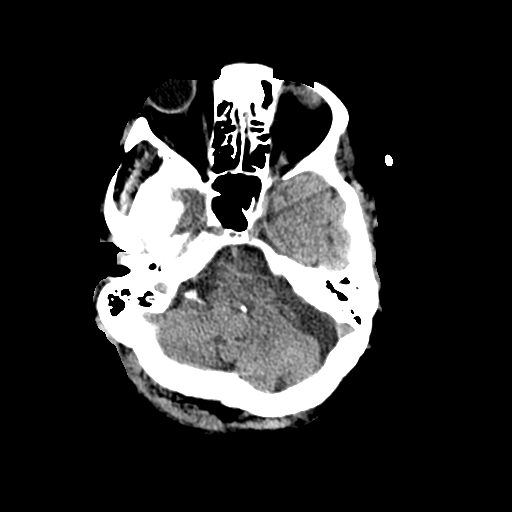
[im 12/32  brain]
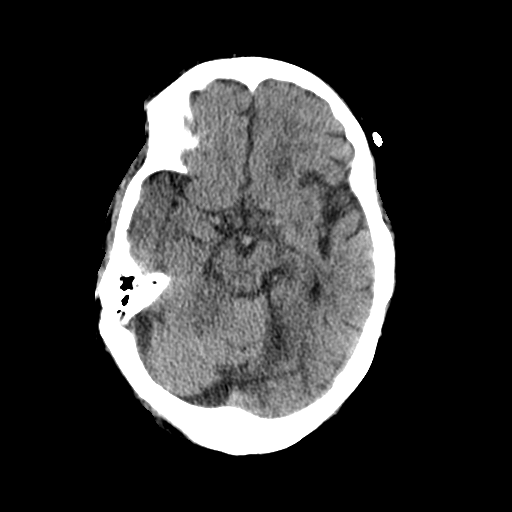
[im 16/32  brain]
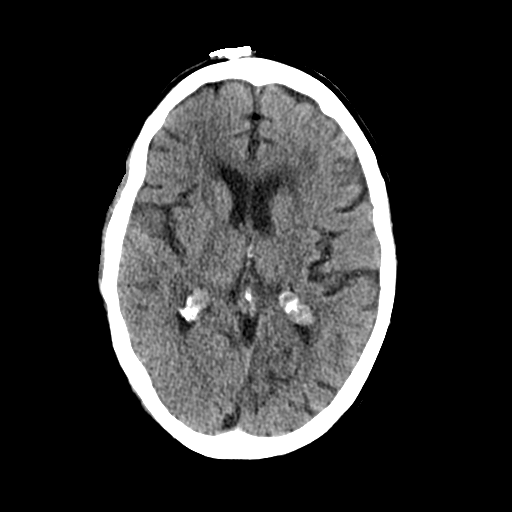
[im 20/32  brain]
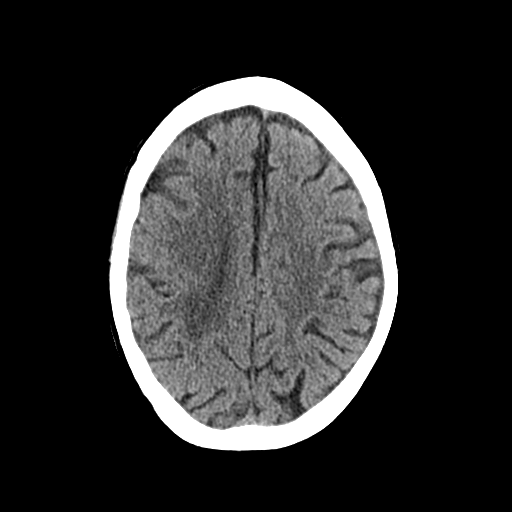
[im 20/32  bone]
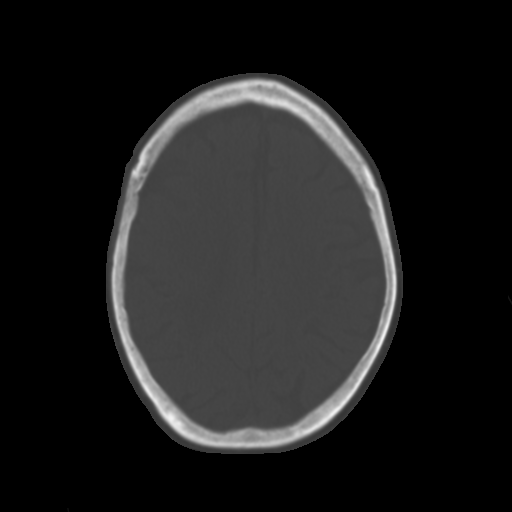
[im 24/32  brain]
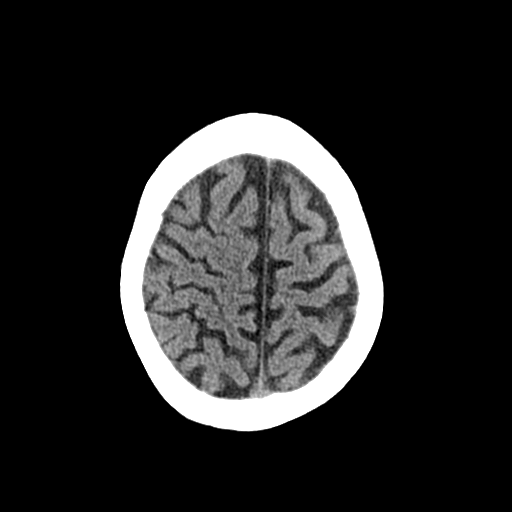
[im 28/32  brain]
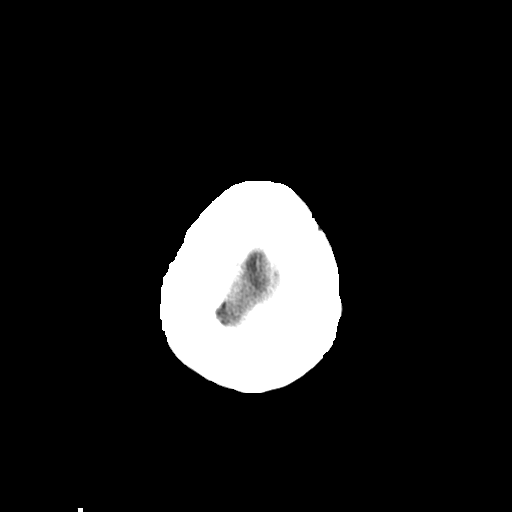

[Series 3: head bone · axial · 0.46mm/px · z∈[+13,+47]mm · 3 of 79 slices shown]
[im 8/79  bone]
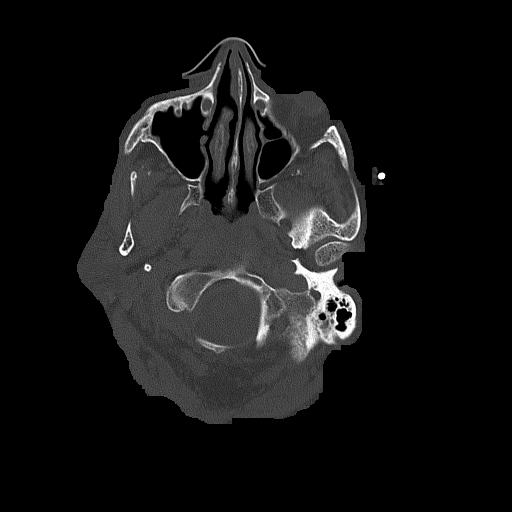
[im 16/79  bone]
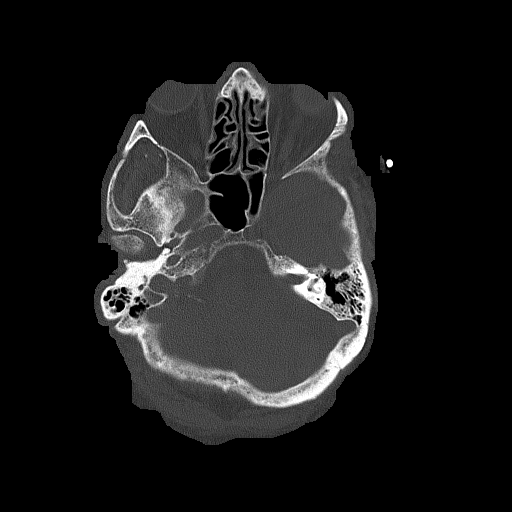
[im 24/79  bone]
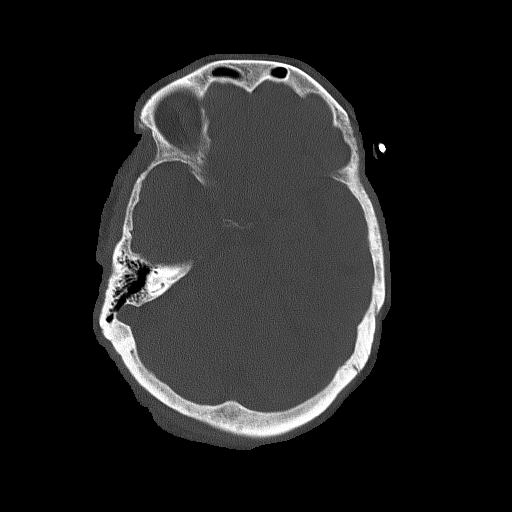

[Series 4: coronal soft · coronal · 0.32mm/px · 3 of 73 slices shown]
[im 25/73  brain]
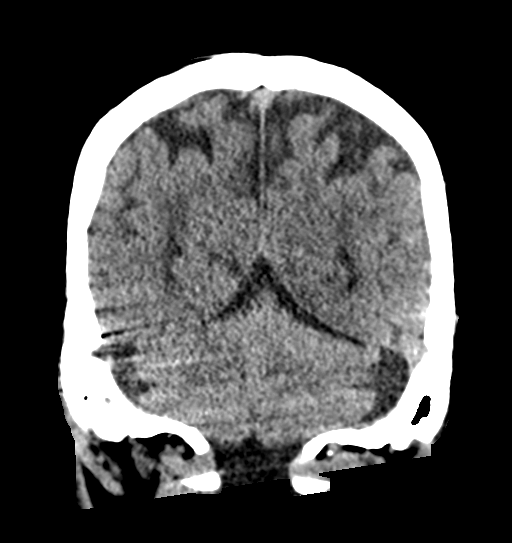
[im 33/73  brain]
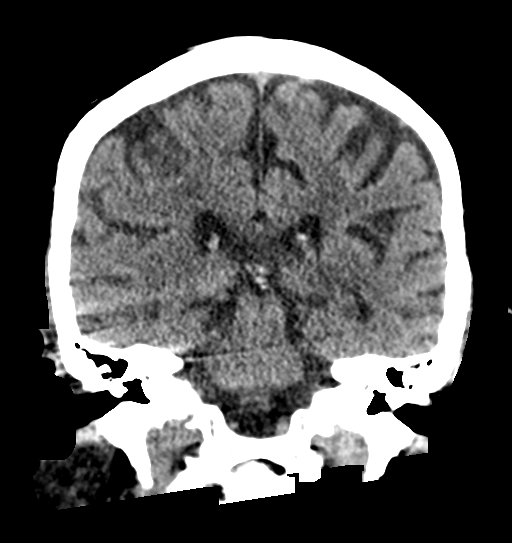
[im 41/73  brain]
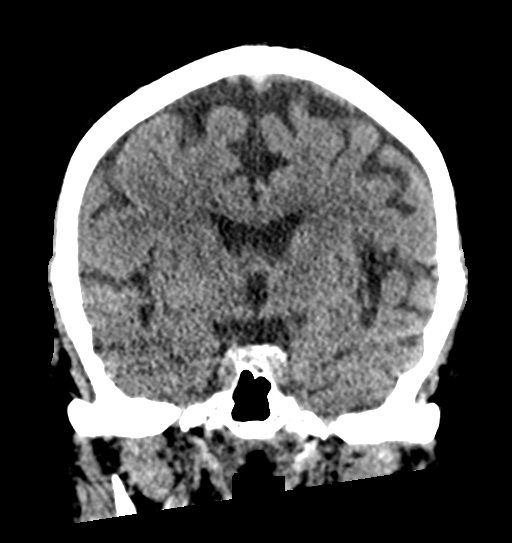

[Series 5: sagittal soft · sagittal · 0.34mm/px · 3 of 56 slices shown]
[im 21/56  brain]
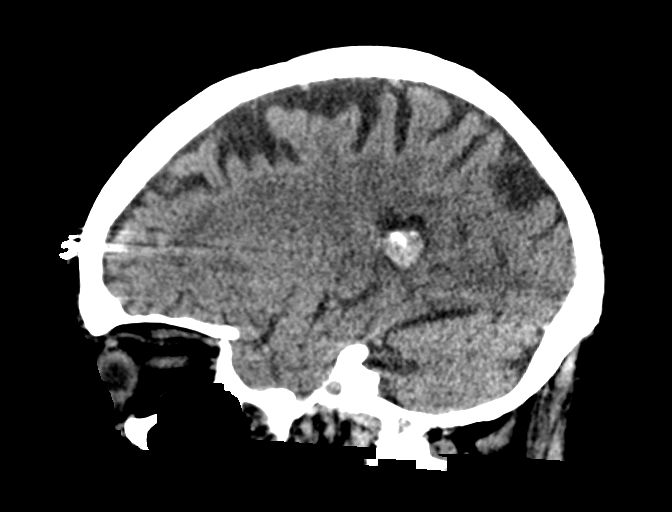
[im 28/56  brain]
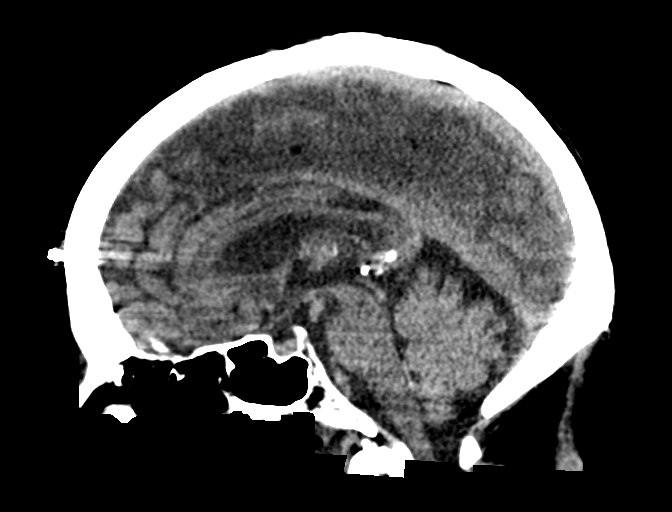
[im 35/56  brain]
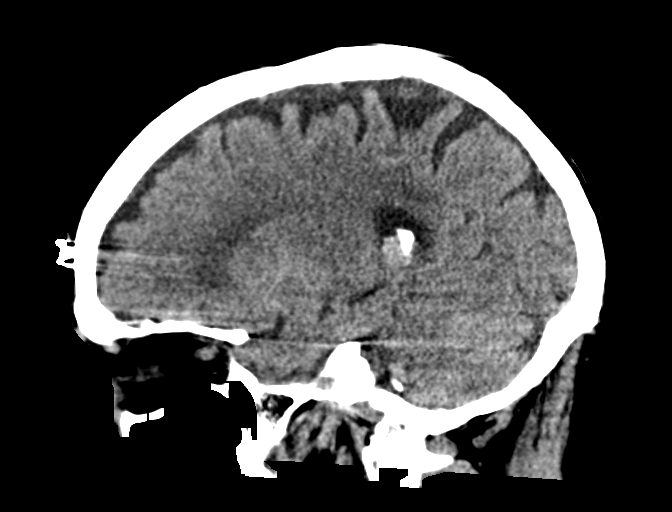

[16 of 47 positions shown; findings below may reference images not displayed]

FINDINGS: Brain: Fairly symmetric and circumscribed bilateral hyperdense
choroid plexus cysts (series 2, image 16). No intraventricular
hemorrhage suspected. A no ventriculomegaly.

No midline shift, mass effect, evidence of mass lesion, intracranial
hemorrhage or evidence of cortically based acute infarction. Patchy
bilateral white matter hypodensity. No cortical encephalomalacia
identified.

Vascular: Mild Calcified atherosclerosis at the skull base. No
suspicious intracranial vascular hyperdensity.

Skull: No fracture identified. Partially visible upper cervical
spine degeneration.

Sinuses/Orbits: Visualized paranasal sinuses and mastoids are well
pneumatized.

Other: No discrete orbit or scalp soft tissue injury identified.
IMPRESSION: No acute intracranial abnormality or acute traumatic injury
identified.
